# Patient Record
Sex: Male | Born: 1950 | Marital: Married | Smoking: Former smoker
Health system: Southern US, Community
[De-identification: ages and names within clinical notes are randomized; demographics above are authoritative.]

## PROBLEM LIST (undated history)

## (undated) DIAGNOSIS — Z794 Long term (current) use of insulin: Secondary | ICD-10-CM

## (undated) DIAGNOSIS — K219 Gastro-esophageal reflux disease without esophagitis: Secondary | ICD-10-CM

## (undated) DIAGNOSIS — M1990 Unspecified osteoarthritis, unspecified site: Secondary | ICD-10-CM

## (undated) DIAGNOSIS — E119 Type 2 diabetes mellitus without complications: Secondary | ICD-10-CM

## (undated) DIAGNOSIS — C801 Malignant (primary) neoplasm, unspecified: Secondary | ICD-10-CM

## (undated) DIAGNOSIS — I10 Essential (primary) hypertension: Secondary | ICD-10-CM

## (undated) HISTORY — DX: Unspecified osteoarthritis, unspecified site: M19.90

## (undated) HISTORY — DX: Essential (primary) hypertension: I10

---

## 2012-03-15 HISTORY — PX: BIOPSY PROSTATE: PRO28

## 2012-04-19 ENCOUNTER — Encounter: Payer: Self-pay | Admitting: Radiation Oncology

## 2012-04-20 ENCOUNTER — Ambulatory Visit
Admission: RE | Admit: 2012-04-20 | Discharge: 2012-04-20 | Disposition: A | Discharge status: Home / Self Care | Payer: BC Managed Care – PPO | Source: Ambulatory Visit | Attending: Radiation Oncology | Admitting: Radiation Oncology

## 2012-04-20 ENCOUNTER — Encounter: Payer: Self-pay | Admitting: Radiation Oncology

## 2012-04-20 VITALS — BP 162/88 | HR 71 | Temp 98.3°F | Wt 251.9 lb

## 2012-04-20 DIAGNOSIS — I10 Essential (primary) hypertension: Secondary | ICD-10-CM

## 2012-04-20 DIAGNOSIS — C61 Malignant neoplasm of prostate: Secondary | ICD-10-CM | POA: Insufficient documentation

## 2012-04-20 DIAGNOSIS — E119 Type 2 diabetes mellitus without complications: Secondary | ICD-10-CM

## 2012-04-20 DIAGNOSIS — E7800 Pure hypercholesterolemia, unspecified: Secondary | ICD-10-CM

## 2012-04-20 NOTE — Progress Notes (Signed)
Patient here today with wife for radiation consultation for newly diagnosed prostate cancer.Patient believes he wants to have seed implant at this point.Alert and oriented x 3.Still employed with [employer].No family history of prostate or any other cancer.

## 2012-04-20 NOTE — Progress Notes (Signed)
  Radiation Oncology         [phone] ________________________________  Name: Timothy Wolfe MRN: [id number]  Date: 04/20/2012  DOB: 1951/04/15  SIMULATION AND TREATMENT PLANNING NOTE PUBIC ARCH STUDY  CC:No primary provider on file.  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  COMPLEX SIMULATION:  The patient presented today for evaluation for possible prostate seed implant. He was brought to the radiation planning suite and placed supine on the CT couch. A 3-dimensional image study set was obtained in upload to the planning computer. There, on each axial slice, I contoured the prostate gland. Then, using three-dimensional radiation planning tools I reconstructed the prostate in view of the structures from the transperineal needle pathway to assess for possible pubic arch interference. In doing so, I did not appreciate any pubic arch interference. Also, the patient's prostate volume was estimated based on the drawn structure. The volume was 57.89 cc.  Given the pubic arch appearance and prostate volume, patient remains a good candidate to proceed with prostate seed implant. Today, he freely provided informed written consent to proceed.    PLAN: The patient will undergo prostate seed implant with I-125 to 145 Gy.   ________________________________  Artist Pais. Kathrynn Running, M.D.

## 2012-04-20 NOTE — Progress Notes (Signed)
Please see the Nurse Progress Note in the MD Initial Consult Encounter for this patient. 

## 2012-04-21 ENCOUNTER — Other Ambulatory Visit: Payer: Self-pay | Admitting: Urology

## 2012-04-21 ENCOUNTER — Encounter: Payer: Self-pay | Admitting: Radiation Oncology

## 2012-04-21 ENCOUNTER — Telehealth: Payer: Self-pay | Admitting: *Deleted

## 2012-04-21 DIAGNOSIS — E7800 Pure hypercholesterolemia, unspecified: Secondary | ICD-10-CM | POA: Insufficient documentation

## 2012-04-21 DIAGNOSIS — I10 Essential (primary) hypertension: Secondary | ICD-10-CM | POA: Insufficient documentation

## 2012-04-21 DIAGNOSIS — E119 Type 2 diabetes mellitus without complications: Secondary | ICD-10-CM | POA: Insufficient documentation

## 2012-04-21 NOTE — Progress Notes (Signed)
Radiation Oncology         [phone]) [phone] ________________________________  Initial outpatient Consultation  Name: Timothy Wolfe MRN: [id number]  Date: 04/20/2012  DOB: 03-18-1951  CC:  Garnett Farm, MD   REFERRING PHYSICIAN: Garnett Farm, MD  DIAGNOSIS: 61 year old gentleman with stage T1c adenocarcinoma prostate with Gleason score 3+3 PSA of 7.9  HISTORY OF PRESENT ILLNESS::Timothy Wolfe is a 61 y.o. gentleman.  He was noted to have an elevated PSA of 7.9 by his primary care physician, Dr. Royal Hawthorn.  Accordingly, he was referred for evaluation in urology by Dr. Vernie Ammons on 03/03/2012,  digital rectal examination was performed at that time revealing 1+ prostate with no nodules.  The patient proceeded to transrectal ultrasound with 12 biopsies of the prostate on 03/15/2012.  The prostate volume measured 63 cc.  Out of 12 core biopsies, one was positive.  The maximum Gleason score was 3+3, and this was seen in 20% of the left base.  The patient reviewed the biopsy results with his urologist and he has kindly been referred today for discussion of potential radiation treatment options.   PREVIOUS RADIATION THERAPY: No  PAST MEDICAL HISTORY:  has a past medical history of Arthritis; Diabetes 1.5, managed as type 2; and Hypertension.    PAST SURGICAL HISTORY: Past Surgical History  Procedure Date  . Biopsy prostate 03/15/2012    3+3=6    FAMILY HISTORY: family history is not on file.  SOCIAL HISTORY:  reports that he quit smoking about 32 years ago. His smoking use included Cigarettes. He smoked 1 pack per day. He has never used smokeless tobacco. He reports that he does not drink alcohol.  ALLERGIES: Review of patient's allergies indicates not on file.  MEDICATIONS:  Current Outpatient Prescriptions  Medication Sig Dispense Refill  . cloNIDine (CATAPRES) 0.1 MG tablet Take 0.1 mg by mouth 2 (two) times daily.      . furosemide (LASIX) 20 MG tablet Take 20 mg by mouth 2 (two) times  daily. 2 po qam and 1 po qpm      . glimepiride (AMARYL) 4 MG tablet Take 4 mg by mouth daily before breakfast.      . hydrALAZINE (APRESOLINE) 25 MG tablet Take 25 mg by mouth QID.       Marland Kitchen insulin aspart (NOVOLOG) 100 UNIT/ML injection Inject into the skin 3 (three) times daily before meals.      . metoprolol succinate (TOPROL-XL) 50 MG 24 hr tablet Take 50 mg by mouth daily. Take with or immediately following a meal.       . potassium chloride SA (K-DUR,KLOR-CON) 20 MEQ tablet Take 20 mEq by mouth 2 (two) times daily.       . ranitidine (ZANTAC) 150 MG tablet Take 150 mg by mouth every morning.       Marland Kitchen aspirin 81 MG tablet Take 81 mg by mouth daily.      . chlorthalidone (HYGROTON) 25 MG tablet Take 25 mg by mouth daily.      . insulin detemir (LEVEMIR) 100 UNIT/ML injection Inject 35 Units into the skin at bedtime.      Marland Kitchen lisinopril (PRINIVIL,ZESTRIL) 40 MG tablet Take 40 mg by mouth daily.      . rosuvastatin (CRESTOR) 10 MG tablet Take 10 mg by mouth daily.        REVIEW OF SYSTEMS:  A 15 point review of systems is documented in the electronic medical record. This was obtained by the nursing staff. However, I  reviewed this with the patient to discuss relevant findings and make appropriate changes.  A comprehensive review of systems was negative.  The patient did complete the urinary function questionnaire indicating moderate outflow obstructive symptoms with a total IPS S. score of 10.  He reported baseline erectile dysfunction   PHYSICAL EXAM:  weight is 251 lb 14.4 oz (114.261 kg). His temperature is 98.3 F (36.8 C). His blood pressure is 162/88 and his pulse is 71.   Patient was in no acute distress today. Is alert and oriented. Further detailed physical exams deferred today in the interest of patient counseling. please note the digital rectal exam findings described above as performed by Dr. Marcelle Overlie.  IMPRESSION: Timothy Wolfe is a very nice 61 year old gentleman with stage T1c  adenocarcinoma prostate with Gleason score 3+3 PSA of 7.9. He falls into the favorable subgroup of patients eligible for a variety of potential curative treatment options including watchful waiting, radical prostatectomy, external beam radiation treatment or prostate implant as monotherapy.  PLAN:  Today I reviewed the findings and workup thus far.  We discussed the natural history of prostate cancer.  We reviewed the the implications of T-stage, Gleason's Score, and PSA on decision-making and outcomes in prostate cancer.  We discussed radiation treatment in the management of prostate cancer with regard to the logistics and delivery of external beam radiation treatment as well as the logistics and delivery of prostate brachytherapy.  We compared and contrasted each of these approaches and also compared these against prostatectomy.  The patient expressed interest in prostate brachytherapy.  I filled out a patient counseling form for him with relevant treatment diagrams and we retained a copy for our records.   The patient would like to proceed with prostate brachytherapy.  I will share my findings with Dr. Vernie Ammons and move forward with scheduling the procedure in the near future.     I enjoyed meeting with him today, and will look forward to participating in the care of this very nice gentleman.  I spent 60 minutes minutes face to face with the patient and more than 50% of that time was spent in counseling and/or coordination of care.   ------------------------------------------------  Artist Pais. Kathrynn Running, M.D.

## 2012-04-21 NOTE — Telephone Encounter (Signed)
Called patient to inform of implant date, spoke with patient and he is aware of this procedure °

## 2012-05-03 ENCOUNTER — Encounter (HOSPITAL_BASED_OUTPATIENT_CLINIC_OR_DEPARTMENT_OTHER)
Admission: RE | Admit: 2012-05-03 | Discharge: 2012-05-03 | Disposition: A | Discharge status: Home / Self Care | Payer: BC Managed Care – PPO | Source: Ambulatory Visit | Attending: Urology | Admitting: Urology

## 2012-05-03 ENCOUNTER — Ambulatory Visit (HOSPITAL_BASED_OUTPATIENT_CLINIC_OR_DEPARTMENT_OTHER)
Admission: RE | Admit: 2012-05-03 | Discharge: 2012-05-03 | Disposition: A | Discharge status: Home / Self Care | Payer: BC Managed Care – PPO | Source: Ambulatory Visit | Attending: Urology | Admitting: Urology

## 2012-05-03 DIAGNOSIS — I440 Atrioventricular block, first degree: Secondary | ICD-10-CM | POA: Insufficient documentation

## 2012-05-03 DIAGNOSIS — Z01818 Encounter for other preprocedural examination: Secondary | ICD-10-CM | POA: Insufficient documentation

## 2012-05-26 ENCOUNTER — Telehealth: Payer: Self-pay | Admitting: *Deleted

## 2012-05-26 NOTE — Telephone Encounter (Signed)
Called patient to remind of appt., lvm for a return call 

## 2012-05-27 ENCOUNTER — Encounter (HOSPITAL_BASED_OUTPATIENT_CLINIC_OR_DEPARTMENT_OTHER): Payer: Self-pay | Admitting: *Deleted

## 2012-05-27 LAB — CBC
HCT: 43.4 % (ref 39.0–52.0)
Hemoglobin: 15.5 g/dL (ref 13.0–17.0)
MCH: 30.8 pg (ref 26.0–34.0)
MCHC: 35.7 g/dL (ref 30.0–36.0)
MCV: 86.1 fL (ref 78.0–100.0)
Platelets: 211 10*3/uL (ref 150–400)
RBC: 5.04 MIL/uL (ref 4.22–5.81)
RDW: 12.4 % (ref 11.5–15.5)
WBC: 7.4 10*3/uL (ref 4.0–10.5)

## 2012-05-27 LAB — APTT: aPTT: 30 seconds (ref 24–37)

## 2012-05-27 LAB — COMPREHENSIVE METABOLIC PANEL
ALT: 20 U/L (ref 0–53)
AST: 19 U/L (ref 0–37)
Albumin: 3.8 g/dL (ref 3.5–5.2)
Alkaline Phosphatase: 75 U/L (ref 39–117)
BUN: 18 mg/dL (ref 6–23)
CO2: 28 mEq/L (ref 19–32)
Calcium: 9.4 mg/dL (ref 8.4–10.5)
Chloride: 104 mEq/L (ref 96–112)
Creatinine, Ser: 1.34 mg/dL (ref 0.50–1.35)
GFR calc Af Amer: 64 mL/min — ABNORMAL LOW (ref >90)
GFR calc non Af Amer: 56 mL/min — ABNORMAL LOW (ref >90)
Glucose, Bld: 119 mg/dL — ABNORMAL HIGH (ref 70–99)
Potassium: 3.4 mEq/L — ABNORMAL LOW (ref 3.5–5.1)
Sodium: 141 mEq/L (ref 135–145)
Total Bilirubin: 0.3 mg/dL (ref 0.3–1.2)
Total Protein: 7.4 g/dL (ref 6.0–8.3)

## 2012-05-27 LAB — PROTIME-INR
INR: 1.06 (ref 0.00–1.49)
Prothrombin Time: 14 seconds (ref 11.6–15.2)

## 2012-05-27 NOTE — Progress Notes (Signed)
NPO AFTER MN. ARRIVES AT 0815. CURRENT EKG AND CXR IN EPIC AND CHART. LAB WORK DONE TODAY.  WILL TAKE CLONIDINE AND METOPROLOL AM OF SURG W/ SIP OF WATER AND DO FLEET ENEMA.

## 2012-06-02 ENCOUNTER — Telehealth: Payer: Self-pay | Admitting: *Deleted

## 2012-06-02 NOTE — Telephone Encounter (Signed)
Called patient to remind of procedure for 06-03-12, lvm for a return call

## 2012-06-02 NOTE — H&P (Signed)
History of Present Illness      Adenocarcinoma of the prostate: His PSA was noted to be  normal at 4.0 in 9/11 however it had increased to 7.9 in 5/13. He was noted to have no abnormality on DRE. He underwent TRUS/BX on 03/15/12 which revealed a 63 cc prostate. Pathology: Gleason 3+3 = 6 in 20% of 1/12 cores (left base medially). Clinical stage: T1c  BPH with mild LUTS: He has noted some obstructive symptoms such as slowing of his urinary stream as well as some intermittency. He also reports occasionally he'll have a little bit of mild dysuria. IPSS 03/03/12: 12/35 with a bother score of mixed.   Interval history: He took the Rapaflo and found that he did have improved voiding but developed significant difficulty breathing especially at night. He said it also caused his blood sugar to run a little bit higher and he also thought it might have created some anxiety but I told him I thought that was more likely due to his recent diagnosis of cancer.   Past Medical History Problems  1. History of  Arthritis V13.4 2. History of  Diabetes Mellitus 250.00 3. Former Smoker V15.82 4. History of  Hypertension 401.9  Surgical History Problems  1. History of  Biopsy Of The Prostate Needle 2. History of  No Surgical Problems  Current Meds 1. Aspirin 81 MG Oral Tablet; Therapy: (Recorded:06Jun2013) to 2. Centrum Silver Oral Tablet; Therapy: (Recorded:06Jun2013) to 3. Chlorthalidone 25 MG Oral Tablet; Therapy: 04Jun2012 to 4. CloNIDine HCl 0.1 MG Oral Tablet; Therapy: (Recorded:06Jun2013) to 5. Crestor 10 MG Oral Tablet; Therapy: 17Sep2012 to 6. Furosemide 20 MG Oral Tablet; Therapy: (Recorded:06Jun2013) to 7. Glimepiride 4 MG Oral Tablet; Therapy: (Recorded:06Jun2013) to 8. HydrALAZINE HCl 25 MG Oral Tablet; Therapy: 04Mar2013 to 9. Levemir FlexPen 100 UNIT/ML Subcutaneous Solution; Therapy: 19Sep2012 to 10. Levofloxacin 500 MG Oral Tablet; 1 po q day beginning the day prior to biopsy; Therapy:  06Jun2013 to (Evaluate:09Jun2013)  Requested for: 06Jun2013; Last Rx:06Jun2013 11. Lisinopril 40 MG Oral Tablet; Therapy: (Recorded:06Jun2013) to 12. Metoprolol Tartrate 50 MG Oral Tablet; Therapy: (Recorded:06Jun2013) to 13. NovoLOG FlexPen 100 UNIT/ML Subcutaneous Solution; Therapy: 25Jun2012 to 14. Potassium Chloride Crys ER 20 MEQ Oral Tablet Extended Release; Therapy: 26Jun2012 to 15. Rapaflo 8 MG Oral Capsule; 1 po q day; Therapy: 02Jul2013 to (Last Rx:02Jul2013) 16. Zantac 150 MG CAPS; Therapy: (Recorded:06Jun2013) to  Allergies Medication  1. No Known Drug Allergies  Family History Problems  1. Maternal history of  Acute Myocardial Infarction V17.3 2. Family history of  Death In The Family Father 76 3. Family history of  Death In The Family Mother 31 4. Family history of  Family Health Status Number Of Children 1 son 5. Paternal history of  Stroke Syndrome V17.1  Social History Problems  1. Caffeine Use 2 a day 2. 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 History of  Alcohol Use  Review of Systems Genitourinary, constitutional, skin, eye, otolaryngeal, hematologic/lymphatic, cardiovascular, pulmonary, endocrine, musculoskeletal, gastrointestinal, neurological and psychiatric system(s) were reviewed and pertinent findings if present are noted.  Genitourinary: nocturia, difficulty starting the urinary stream, weak urinary stream and urinary stream starts and stops.  Gastrointestinal: nausea and heartburn.  Musculoskeletal: joint pain.  Neurological: headache.    Vitals Vital Signs  BMI Calculated: 32.73 BSA Calculated: 2.41 Height: 6 ft 2 in Weight: 255 lb  Blood Pressure: 183 / 94 Heart Rate: 67  Physical Exam Constitutional: Well nourished and well developed . No  acute distress.  ENT:. The ears and nose are normal in appearance.  Neck: The appearance of the neck is normal and no neck mass is present.  Pulmonary: No respiratory  distress and normal respiratory rhythm and effort.  Cardiovascular: Heart rate and rhythm are normal . No peripheral edema.  Abdomen: The abdomen is soft and nontender. No masses are palpated. No CVA tenderness. No hernias are palpable. No hepatosplenomegaly noted.  Rectal: Rectal exam demonstrates normal sphincter tone, no tenderness and no masses. Estimated prostate size is 1+. The prostate has no nodularity and is not tender. The left seminal vesicle is nonpalpable. The right seminal vesicle is nonpalpable. The perineum is normal on inspection.  Genitourinary: Examination of the penis demonstrates no discharge, no masses, no lesions and a normal meatus. The scrotum is without lesions. The right epididymis is palpably normal and non-tender. The left epididymis is palpably normal and non-tender. The right testis is non-tender and without masses. The left testis is non-tender and without masses.  Lymphatics: The femoral and inguinal nodes are not enlarged or tender.  Skin: Normal skin turgor, no visible rash and no visible skin  Assessment Assessed  1. Adenocarcinoma Of The Prostate Gland 185  Plan  The patient was counseled about the natural history of prostate cancer and the standard treatment options that are available for prostate cancer. It was explained to him how his age and life expectancy, clinical stage, Gleason score, and PSA affect his prognosis, the decision to proceed with additional staging studies, as well as how that information influences recommended treatment strategies. We discussed the roles for active surveillance, radiation therapy, surgical therapy, androgen deprivation, as well as ablative therapy options for the treatment of prostate cancer as appropriate to his individual cancer situation. We discussed the risks and benefits of these options with regard to their impact on cancer control and also in terms of potential adverse events, complications, and impact on quiality of  life particularly related to urinary, bowel, and sexual function. The patient was encouraged to ask questions throughout the discussion today and all questions were answered to his stated satisfaction. In addition, the patient was provided with and/or directed to appropriate resources and literature for further education about prostate cancer and treatment options.  A total of 45 minutes were spent in the overall care of the patient today with 45 minutes in direct face to face consultation.   He came to the decision that he would like to proceed with treatment and has elected to proceed with radioactive seed implantation. His prostate is at the upper limits of normal but I think he should be all right for implantation. In addition he does have some obstructive symptoms and sounds like he's not going to be able to tolerate an alpha-blocker. He may need a 5 alpha reductase inhibitor in the long term.

## 2012-06-02 NOTE — Telephone Encounter (Signed)
Called patient to remind of procedure for 06-03-12, spoke with patient and he is aware of this procedure

## 2012-06-02 NOTE — Telephone Encounter (Signed)
CALLED PATIENT TO REMIND OF PROCEDURE FOR 06-03-12, LINE BUSY WILL CALL LATER

## 2012-06-03 ENCOUNTER — Encounter (HOSPITAL_BASED_OUTPATIENT_CLINIC_OR_DEPARTMENT_OTHER): Payer: Self-pay | Admitting: Anesthesiology

## 2012-06-03 ENCOUNTER — Ambulatory Visit (HOSPITAL_BASED_OUTPATIENT_CLINIC_OR_DEPARTMENT_OTHER)
Admission: RE | Admit: 2012-06-03 | Discharge: 2012-06-03 | Disposition: A | Discharge status: Home / Self Care | Payer: BC Managed Care – PPO | Source: Ambulatory Visit | Attending: Urology | Admitting: Urology

## 2012-06-03 ENCOUNTER — Encounter (HOSPITAL_BASED_OUTPATIENT_CLINIC_OR_DEPARTMENT_OTHER)
Admission: RE | Disposition: A | Discharge status: Home / Self Care | Payer: Self-pay | Source: Ambulatory Visit | Attending: Urology

## 2012-06-03 ENCOUNTER — Encounter (HOSPITAL_BASED_OUTPATIENT_CLINIC_OR_DEPARTMENT_OTHER): Payer: Self-pay | Admitting: *Deleted

## 2012-06-03 ENCOUNTER — Ambulatory Visit (HOSPITAL_BASED_OUTPATIENT_CLINIC_OR_DEPARTMENT_OTHER): Payer: BC Managed Care – PPO | Admitting: Anesthesiology

## 2012-06-03 ENCOUNTER — Ambulatory Visit (HOSPITAL_COMMUNITY): Payer: BC Managed Care – PPO

## 2012-06-03 DIAGNOSIS — Z79899 Other long term (current) drug therapy: Secondary | ICD-10-CM | POA: Insufficient documentation

## 2012-06-03 DIAGNOSIS — C61 Malignant neoplasm of prostate: Secondary | ICD-10-CM | POA: Insufficient documentation

## 2012-06-03 DIAGNOSIS — Z7982 Long term (current) use of aspirin: Secondary | ICD-10-CM | POA: Insufficient documentation

## 2012-06-03 DIAGNOSIS — I10 Essential (primary) hypertension: Secondary | ICD-10-CM | POA: Insufficient documentation

## 2012-06-03 DIAGNOSIS — E119 Type 2 diabetes mellitus without complications: Secondary | ICD-10-CM | POA: Insufficient documentation

## 2012-06-03 HISTORY — PX: RADIOACTIVE SEED IMPLANT: SHX5150

## 2012-06-03 HISTORY — DX: Type 2 diabetes mellitus without complications: E11.9

## 2012-06-03 HISTORY — DX: Long term (current) use of insulin: Z79.4

## 2012-06-03 HISTORY — DX: Gastro-esophageal reflux disease without esophagitis: K21.9

## 2012-06-03 LAB — POCT I-STAT 4, (NA,K, GLUC, HGB,HCT)
Glucose, Bld: 145 mg/dL — ABNORMAL HIGH (ref 70–99)
HCT: 43 % (ref 39.0–52.0)
Hemoglobin: 14.6 g/dL (ref 13.0–17.0)
Potassium: 3.4 mEq/L — ABNORMAL LOW (ref 3.5–5.1)
Sodium: 144 mEq/L (ref 135–145)

## 2012-06-03 LAB — GLUCOSE, CAPILLARY: Glucose-Capillary: 150 mg/dL — ABNORMAL HIGH (ref 70–99)

## 2012-06-03 SURGERY — INSERTION, RADIATION SOURCE, PROSTATE
Anesthesia: General | Site: Prostate | Wound class: Clean Contaminated

## 2012-06-03 MED ORDER — ONDANSETRON HCL 4 MG/2ML IJ SOLN
INTRAMUSCULAR | Status: DC | PRN
  Administered 2012-06-03: 4 mg via INTRAVENOUS

## 2012-06-03 MED ORDER — CIPROFLOXACIN IN D5W 400 MG/200ML IV SOLN
400.0000 mg | INTRAVENOUS | Status: AC
  Administered 2012-06-03: 400 mg via INTRAVENOUS

## 2012-06-03 MED ORDER — LIDOCAINE HCL (CARDIAC) 20 MG/ML IV SOLN
INTRAVENOUS | Status: DC | PRN
  Administered 2012-06-03: 100 mg via INTRAVENOUS

## 2012-06-03 MED ORDER — MIDAZOLAM HCL 5 MG/5ML IJ SOLN
INTRAMUSCULAR | Status: DC | PRN
  Administered 2012-06-03: 2 mg via INTRAVENOUS

## 2012-06-03 MED ORDER — OXYCODONE HCL 5 MG PO TABS
5.0000 mg | ORAL_TABLET | Freq: Once | ORAL | Status: AC | PRN
  Administered 2012-06-03: 5 mg via ORAL

## 2012-06-03 MED ORDER — MEPERIDINE HCL 25 MG/ML IJ SOLN: 6.2500 mg | INTRAMUSCULAR | Status: DC | PRN

## 2012-06-03 MED ORDER — OXYCODONE HCL 5 MG/5ML PO SOLN: 5.0000 mg | Freq: Once | ORAL | Status: AC | PRN

## 2012-06-03 MED ORDER — LACTATED RINGERS IV SOLN
INTRAVENOUS | Status: DC
  Administered 2012-06-03 (×2): via INTRAVENOUS

## 2012-06-03 MED ORDER — PROPOFOL 10 MG/ML IV BOLUS
INTRAVENOUS | Status: DC | PRN
  Administered 2012-06-03: 200 mg via INTRAVENOUS
  Administered 2012-06-03: 50 mg via INTRAVENOUS

## 2012-06-03 MED ORDER — IOHEXOL 350 MG/ML SOLN
INTRAVENOUS | Status: DC | PRN
  Administered 2012-06-03: 7 mL

## 2012-06-03 MED ORDER — FENTANYL CITRATE 0.05 MG/ML IJ SOLN
INTRAMUSCULAR | Status: DC | PRN
  Administered 2012-06-03 (×2): 50 ug via INTRAVENOUS

## 2012-06-03 MED ORDER — CIPROFLOXACIN HCL 500 MG PO TABS: 500.0000 mg | ORAL_TABLET | Freq: Two times a day (BID) | ORAL | Status: AC

## 2012-06-03 MED ORDER — FLEET ENEMA 7-19 GM/118ML RE ENEM: 1.0000 | ENEMA | Freq: Once | RECTAL | Status: DC

## 2012-06-03 MED ORDER — ACETAMINOPHEN 10 MG/ML IV SOLN: 1000.0000 mg | Freq: Once | INTRAVENOUS | Status: DC | PRN

## 2012-06-03 MED ORDER — HYDROMORPHONE HCL PF 1 MG/ML IJ SOLN: 0.2500 mg | INTRAMUSCULAR | Status: DC | PRN

## 2012-06-03 MED ORDER — HYDROCODONE-ACETAMINOPHEN 7.5-325 MG PO TABS: 1.0000 | ORAL_TABLET | ORAL | Status: AC | PRN

## 2012-06-03 MED ORDER — PROMETHAZINE HCL 25 MG/ML IJ SOLN: 6.2500 mg | INTRAMUSCULAR | Status: DC | PRN

## 2012-06-03 SURGICAL SUPPLY — 27 items
BAG URINE DRAINAGE (UROLOGICAL SUPPLIES) ×2 IMPLANT
BLADE SURG ROTATE 9660 (MISCELLANEOUS) ×2 IMPLANT
CATH FOLEY 2WAY SLVR  5CC 16FR (CATHETERS) ×2
CATH FOLEY 2WAY SLVR 5CC 16FR (CATHETERS) ×2 IMPLANT
CATH ROBINSON RED A/P 20FR (CATHETERS) ×2 IMPLANT
CLOTH BEACON ORANGE TIMEOUT ST (SAFETY) ×2 IMPLANT
COVER MAYO STAND STRL (DRAPES) ×2 IMPLANT
COVER TABLE BACK 60X90 (DRAPES) ×2 IMPLANT
DRSG TEGADERM 4X4.75 (GAUZE/BANDAGES/DRESSINGS) ×2 IMPLANT
DRSG TEGADERM 8X12 (GAUZE/BANDAGES/DRESSINGS) ×2 IMPLANT
GLOVE BIO SURGEON STRL SZ 6.5 (GLOVE) ×2 IMPLANT
GLOVE BIO SURGEON STRL SZ7.5 (GLOVE) IMPLANT
GLOVE BIO SURGEON STRL SZ8 (GLOVE) ×4 IMPLANT
GLOVE ECLIPSE 8.0 STRL XLNG CF (GLOVE) ×10 IMPLANT
GOWN STRL NON-REIN LRG LVL3 (GOWN DISPOSABLE) ×2 IMPLANT
GOWN STRL REIN XL XLG (GOWN DISPOSABLE) ×4 IMPLANT
GOWN XL W/COTTON TOWEL STD (GOWNS) ×2 IMPLANT
HOLDER FOLEY CATH W/STRAP (MISCELLANEOUS) ×2 IMPLANT
IV NS IRRIG 3000ML ARTHROMATIC (IV SOLUTION) IMPLANT
IV WATER IRR. 1000ML (IV SOLUTION) ×2 IMPLANT
Nucletron ×158 IMPLANT
PACK CYSTOSCOPY (CUSTOM PROCEDURE TRAY) ×2 IMPLANT
SPONGE GAUZE 4X4 12PLY (GAUZE/BANDAGES/DRESSINGS) ×2 IMPLANT
SYRINGE 10CC LL (SYRINGE) ×2 IMPLANT
UNDERPAD 30X30 INCONTINENT (UNDERPADS AND DIAPERS) ×4 IMPLANT
WATER STERILE IRR 3000ML UROMA (IV SOLUTION) ×2 IMPLANT
WATER STERILE IRR 500ML POUR (IV SOLUTION) ×2 IMPLANT

## 2012-06-03 NOTE — Anesthesia Preprocedure Evaluation (Addendum)
Anesthesia Evaluation  Patient identified by MRN, date of birth, ID band Patient awake    Reviewed: Allergy & Precautions, H&P , NPO status , Patient's Chart, lab work & pertinent test results  History of Anesthesia Complications Negative for: history of anesthetic complications  Airway Mallampati: II  Neck ROM: Full  Mouth opening: Limited Mouth Opening  Dental  (+) Teeth Intact and Dental Advisory Given   Pulmonary neg pulmonary ROS,  breath sounds clear to auscultation  Pulmonary exam normal       Cardiovascular hypertension, Pt. on medications and Pt. on home beta blockers Rhythm:Regular Rate:Normal  LVH by voltage, 1st deg AV block   Neuro/Psych negative neurological ROS  negative psych ROS   GI/Hepatic Neg liver ROS, GERD-  Medicated,  Endo/Other  diabetes, Type 2, Insulin Dependent  Renal/GU Renal InsufficiencyRenal diseaseGFR ~60     Musculoskeletal  (+) Arthritis -, Osteoarthritis,    Abdominal (+) + obese,   Peds  Hematology negative hematology ROS (+)   Anesthesia Other Findings   Reproductive/Obstetrics                      Anesthesia Physical Anesthesia Plan  ASA: III  Anesthesia Plan: General   Post-op Pain Management:    Induction: Intravenous  Airway Management Planned: LMA  Additional Equipment:   Intra-op Plan:   Post-operative Plan:   Informed Consent: I have reviewed the patients History and Physical, chart, labs and discussed the procedure including the risks, benefits and alternatives for the proposed anesthesia with the patient or authorized representative who has indicated his/her understanding and acceptance.   Dental advisory given  Plan Discussed with: CRNA and Surgeon  Anesthesia Plan Comments:        Anesthesia Quick Evaluation

## 2012-06-03 NOTE — Interval H&P Note (Signed)
History and Physical Interval Note:  06/03/2012 8:56 AM  Timothy Wolfe  has presented today for surgery, with the diagnosis of PROSTATE CANCER  The various methods of treatment have been discussed with the patient and family. After consideration of risks, benefits and other options for treatment, the patient has consented to  Procedure(s) (LRB) with comments: RADIOACTIVE SEED IMPLANT (N/A) - rad tech ok per vickie at main DR PORTABLE NEEDED as a surgical intervention .  The patient's history has been reviewed, patient examined, no change in status, stable for surgery.  I have reviewed the patient's chart and labs.  Questions were answered to the patient's satisfaction.     Garnett Farm

## 2012-06-03 NOTE — Anesthesia Procedure Notes (Signed)
Procedure Name: LMA Insertion Date/Time: 06/03/2012 9:41 AM Performed by: Maris Berger T Pre-anesthesia Checklist: Patient identified, Emergency Drugs available, Suction available and Patient being monitored Patient Re-evaluated:Patient Re-evaluated prior to inductionOxygen Delivery Method: Circle System Utilized Preoxygenation: Pre-oxygenation with 100% oxygen Intubation Type: IV induction Ventilation: Mask ventilation without difficulty LMA: LMA inserted LMA Size: 5.0 Number of attempts: 1 Placement Confirmation: positive ETCO2 Dental Injury: Teeth and Oropharynx as per pre-operative assessment  Comments: Gauze roll between teeth

## 2012-06-03 NOTE — Op Note (Signed)
PATIENT:  Timothy Wolfe  PRE-OPERATIVE DIAGNOSIS:  Adenocarcinoma of the prostate  POST-OPERATIVE DIAGNOSIS:  Same  PROCEDURE:  Procedure(s): 1. I-125 radioactive seed implantation 2. Cystoscopy  SURGEON:  Surgeon(s): Garnett Farm  Radiation oncologist: Dr. Margaretmary Dys  ANESTHESIA:  General  EBL:  Minimal  DRAINS: 16 French Foley catheter  INDICATION: Timothy Wolfe is a 61 year old male with biopsy-proven adenocarcinoma of the prostate. He was found to have Gleason 6 adenocarcinoma and a single core involving the left basilar region on biopsy. Scleral surgical stage is T1c and he has elected to proceed with radioactive seed implantation.  Description of procedure: After informed consent the patient was brought to the major OR, placed on the table and administered general anesthesia. He was then moved to the modified lithotomy position with his perineum perpendicular to the floor. His perineum and genitalia were then sterilely prepped. An official timeout was then performed. A 16 French Foley catheter was then placed in the bladder and filled with dilute contrast, a rectal tube was placed in the rectum and the transrectal ultrasound probe was placed in the rectum and affixed to the stand. He was then sterilely draped. A  Real time ultrasonography was used along with the seed planning software spot-pro version 3.1-00. This was used to develop the seed plan including the number of needles as well as number of seeds required for complete and adequate coverage. Real-time ultrasonography was then used along with the previously developed plan and the Nucletron device to implant a total of 79 seeds using 23 needles. This proceeded without difficulty or complication.  A Foley catheter was then removed as well as the transrectal ultrasound probe and rectal probe. Flexible cystoscopy was then performed using the 17 French flexible scope which revealed a normal urethra throughout its length  down to the sphincter which appeared intact. The prostatic urethra revealed bilobar hypertrophy but no evidence of obstruction, seeds, spacers or lesions. The bladder was then entered and fully and systematically inspected. The ureteral orifices were noted to be of normal configuration and position. The mucosa revealed no evidence of tumors. There were also no stones identified within the bladder. I noted no seeds or spacers on the floor of the bladder and retroflexion of the scope revealed no seeds protruding from the base of the prostate.  The cystoscope was then removed and a new 16 French Foley catheter was then inserted and the balloon was filled with 10 cc of sterile water. This was connected to closed system drainage and the patient was awakened and taken to recovery room in stable and satisfactory condition. He tolerated procedure well and there were no intraoperative complications.

## 2012-06-03 NOTE — Anesthesia Postprocedure Evaluation (Signed)
Anesthesia Post Note  Patient: Timothy Wolfe  Procedure(s) Performed: Procedure(s) (LRB): RADIOACTIVE SEED IMPLANT (N/A)  Anesthesia type: General  Patient location: PACU  Post pain: Pain level controlled  Post assessment: Post-op Vital signs reviewed  Last Vitals: BP 165/97  Pulse 60  Temp 36.6 C (Oral)  Resp 14  Ht 6\' 2"  (1.88 m)  Wt 247 lb (112.038 kg)  BMI 31.71 kg/m2  SpO2 98%  Post vital signs: Reviewed  Level of consciousness: sedated  Complications: No apparent anesthesia complications

## 2012-06-03 NOTE — Transfer of Care (Signed)
Immediate Anesthesia Transfer of Care Note  Patient: Timothy Wolfe  Procedure(s) Performed: Procedure(s) (LRB) with comments: RADIOACTIVE SEED IMPLANT (N/A) - rad tech ok per vickie at main DR PORTABLE NEEDED  Patient Location: PACU  Anesthesia Type: General  Level of Consciousness: awake, sedated and responds to stimulation  Airway & Oxygen Therapy: Patient Spontanous Breathing and Patient connected to face mask oxygen  Post-op Assessment: Report given to PACU RN  Post vital signs: Reviewed and stable  Complications: No apparent anesthesia complications

## 2012-06-05 NOTE — Op Note (Signed)
  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  RADIATION DOSE: 145 Gy, definitive therapy.  TECHNIQUE: Damier Disano was brought to the operating room with the urologist. He was placed in the dorsolithotomy position. He was catheterized and a rectal tube was inserted. The perineum was shaved, prepped and draped. The ultrasound probe was then introduced into the rectum to see the prostate gland.  TREATMENT DEVICE: A needle grid was attached to the ultrasound probe stand and anchor needles were placed.  COMPLEX ISODOSE CALCULATION: The prostate was imaged in 3D using a sagittal sweep of the prostate probe. These images were transferred to the planning computer. There, the prostate, urethra and rectum were defined on each axial reconstructed image. Then, the software created an optimized plan and a few seed positions were adjusted. Then the accepted plan was uploaded to the seed Selectron afterloading unit.  SPECIAL TREATMENT PROCEDURE/SUPERVISION AND HANDLING: The Nucletron FIRST system was used to place the needles under sagittal guidance. A total of 23 needles were used to deposit 79 seeds in the prostate gland. The individual seed activity was 0.586 mCi for a total implant activity of 46.294 mCi.  COMPLEX SIMULATION: At the end of the procedure, an anterior radiograph of the pelvis was obtained to document seed positioning and count. Cystoscopy was performed to check the urethra and bladder.  MICRODOSIMETRY: At the end of the procedure, the patient was emitting 0.61 mrem/hr at 1 meter. Accordingly, he was considered safe for hospital discharge.  PLAN: The patient will return to the  radiation oncology clinic for post implant CT dosimetry in three weeks.   ________________________________  Artist Pais Kathrynn Running, M.D.

## 2012-06-06 ENCOUNTER — Encounter (HOSPITAL_BASED_OUTPATIENT_CLINIC_OR_DEPARTMENT_OTHER): Payer: Self-pay | Admitting: Urology

## 2012-06-06 NOTE — Addendum Note (Signed)
Addendum  created 06/06/12 1049 by Briant Sites, CRNA   Modules edited:Anesthesia Responsible Staff

## 2012-06-23 ENCOUNTER — Telehealth: Payer: Self-pay | Admitting: *Deleted

## 2012-06-23 NOTE — Telephone Encounter (Signed)
CALLED PATIENT TO REMIND OF APPTS. FOR 06-24-12, LVM FOR A RETURN CALL

## 2012-06-24 ENCOUNTER — Ambulatory Visit
Admission: RE | Admit: 2012-06-24 | Discharge: 2012-06-24 | Disposition: A | Discharge status: Home / Self Care | Payer: BC Managed Care – PPO | Source: Ambulatory Visit | Attending: Radiation Oncology | Admitting: Radiation Oncology

## 2012-06-24 ENCOUNTER — Encounter: Payer: Self-pay | Admitting: Radiation Oncology

## 2012-06-24 VITALS — BP 159/85 | HR 67 | Temp 98.2°F | Resp 20 | Wt 253.3 lb

## 2012-06-24 DIAGNOSIS — C61 Malignant neoplasm of prostate: Secondary | ICD-10-CM

## 2012-06-24 NOTE — Progress Notes (Signed)
Radiation Oncology         [phone] ________________________________  Name: Timothy Wolfe MRN: [id number]  Date: 06/24/2012  DOB: 28-Dec-1950  Follow-Up Visit Note  CC: No primary provider on file.  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  He is complaining of increased urinary frequency and urinary hesitation symptoms. He filled out a questionnaire regarding urinary function today providing and overall IPSS score of 23 characterizing his symptoms as severe.  His pre-implant score was 10. He denies any bowel symptoms.  ALLERGIES:   has no known allergies.  Meds: Current Outpatient Prescriptions  Medication Sig Dispense Refill  . aspirin 81 MG tablet Take 81 mg by mouth daily.      . chlorthalidone (HYGROTON) 25 MG tablet Take 25 mg by mouth daily.      . cloNIDine (CATAPRES) 0.1 MG tablet Take 0.5 mg by mouth 2 (two) times daily.       . furosemide (LASIX) 20 MG tablet Take 20 mg by mouth daily. 2 po qam and 1 po qpm      . glimepiride (AMARYL) 4 MG tablet Take 4 mg by mouth 2 (two) times daily.       . hydrALAZINE (APRESOLINE) 25 MG tablet Take 25 mg by mouth QID.       Marland Kitchen insulin aspart (NOVOLOG) 100 UNIT/ML injection Inject into the skin 3 (three) times daily before meals. PER SS      . insulin detemir (LEVEMIR) 100 UNIT/ML injection Inject 35 Units into the skin at bedtime.      Marland Kitchen lisinopril (PRINIVIL,ZESTRIL) 40 MG tablet Take 40 mg by mouth daily.      . Meth-Hyo-M Bl-Na Phos-Ph Sal (URIBEL) 118 MG CAPS Take 1 capsule by mouth Twice daily.      . metoprolol (LOPRESSOR) 50 MG tablet Take 50 mg by mouth Twice daily. Takes 1.5 tabs bid      . metoprolol succinate (TOPROL-XL) 50 MG 24 hr tablet Take 75 mg by mouth 2 (two) times daily. Take with or immediately following a meal.      .  potassium chloride SA (K-DUR,KLOR-CON) 20 MEQ tablet Take 20 mEq by mouth 2 (two) times daily.       . ranitidine (ZANTAC) 150 MG tablet Take 150 mg by mouth every morning.       . rosuvastatin (CRESTOR) 10 MG tablet Take 10 mg by mouth every evening.         Physical Findings: The patient is in no acute distress. Patient is alert and oriented.  vitals were not taken for this visit..  No significant changes.  Lab Findings: Lab Results  Component Value Date   WBC 7.4 05/27/2012   HGB 14.6 06/03/2012   HCT 43.0 06/03/2012   MCV 86.1 05/27/2012   PLT 211 05/27/2012    Radiographic Findings:  Patient underwent CT imaging in our clinic for post implant dosimetry. The CT appears to demonstrate an adequate distribution of radioactive seeds throughout the prostate gland. There no seeds in her near the rectum. I suspect the final radiation plan and dosimetry will show appropriate coverage of the prostate gland.   Impression: The patient is recovering from the effects of radiation. His urinary symptoms should gradually improve over the next 4-6 months. We talked about this today. He  is encouraged by his improvement already and is otherwise please with his outcome.   Plan: Today, I spent time talking to the patient about his prostate seed implant and resolving urinary symptoms. Which for long-term followup for prostate cancer following seed implant. He understands that ongoing PSA determinations and digital rectal exams will help perform surveillance to rule out disease recurrence. He understands what to expect with his PSA measures. Patient was also educated today about some of the long-term effects would radiation including the Small risk for rectal bleeding and possibly erectile dysfunction. Talked about some of the general management approaches to these potential complications. However, I did encourage the patient to contact her office or return at any point if he has questions or concerns related to his  previous radiation and prostate cancer.   _____________________________________  Artist Pais. Kathrynn Running, M.D.

## 2012-06-24 NOTE — Assessment & Plan Note (Signed)
Plan seed implant 

## 2012-06-24 NOTE — Progress Notes (Signed)
  Radiation Oncology         [phone] ________________________________  Name: Timothy Wolfe MRN: [id number]  Date: 06/24/2012  DOB: 07-12-1951  COMPLEX SIMULATION NOTE  NARRATIVE:  The patient was brought to the CT Simulation planning suite today following prostate seed implantation approximately one month ago.  Identity was confirmed.  All relevant records and images related to the planned course of therapy were reviewed.  Then, the patient was set-up supine.  CT images were obtained.  The CT images were loaded into the planning software.  Then the prostate and rectum were contoured.  Treatment planning then occurred.  The implanted iodine 125 seeds were identified by the physics staff for projection of radiation distribution  I have requested : 3D Simulation  I have requested a DVH of the following structures: Prostate and rectum.    ________________________________  Artist Pais Kathrynn Running, M.D.

## 2012-06-24 NOTE — Progress Notes (Signed)
Patient her  prostate post seed implant done on 06/03/12 with Dr. Vernie Ammons  With 79 seeds pateint alert,oriented x3, has dysuria frequency,urgenay occasionally stated, also still has blood when voiding last time yesterday reddish gray color stated patient when first starting his stream, nocturia usually 3x night, only got up 1x last night, eating drinking well,  Normal bowel movements,  IPPS =[id number]

## 2012-09-02 ENCOUNTER — Ambulatory Visit
Admission: RE | Admit: 2012-09-02 | Discharge: 2012-09-02 | Disposition: A | Discharge status: Home / Self Care | Payer: BC Managed Care – PPO | Source: Ambulatory Visit | Attending: Radiation Oncology | Admitting: Radiation Oncology

## 2012-09-02 ENCOUNTER — Encounter: Payer: Self-pay | Admitting: Radiation Oncology

## 2012-09-02 DIAGNOSIS — C61 Malignant neoplasm of prostate: Secondary | ICD-10-CM | POA: Insufficient documentation

## 2012-09-18 NOTE — Progress Notes (Signed)
  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 He underwent CT scan to delineate the three-dimensional structures of the pelvis and demonstrate the radiation distribution.  Results:   Prostate Coverage - The dose of radiation delivered to the 90% or more of the prostate gland (D90) was 112.09% of the prescription dose. This exceeds our goal of greater than 90%. Rectal Sparing - The volume of rectal tissue receiving the prescription dose or higher was 0.14 cc. This falls under our thresholds tolerance of 1.0 cc.  Impression: The prostate seed implant appears to show adequate target coverage and appropriate rectal sparing.  Plan:  The patient will continue to follow with urology for ongoing PSA determinations. I would anticipate a high likelihood for local tumor control with minimal risk for rectal morbidity.   Artist Pais Kathrynn Running, M.D.

## 2012-09-28 HISTORY — PX: INSERTION PROSTATE RADIATION SEED: SUR718

## 2012-10-14 IMAGING — CR DG CHEST 2V
2 series · 2 of 2 positions shown · non-contrast
Comparison: No priors.

CLINICAL DATA: Preoperative evaluation.

CHEST - 2 VIEW

[w chest pa]
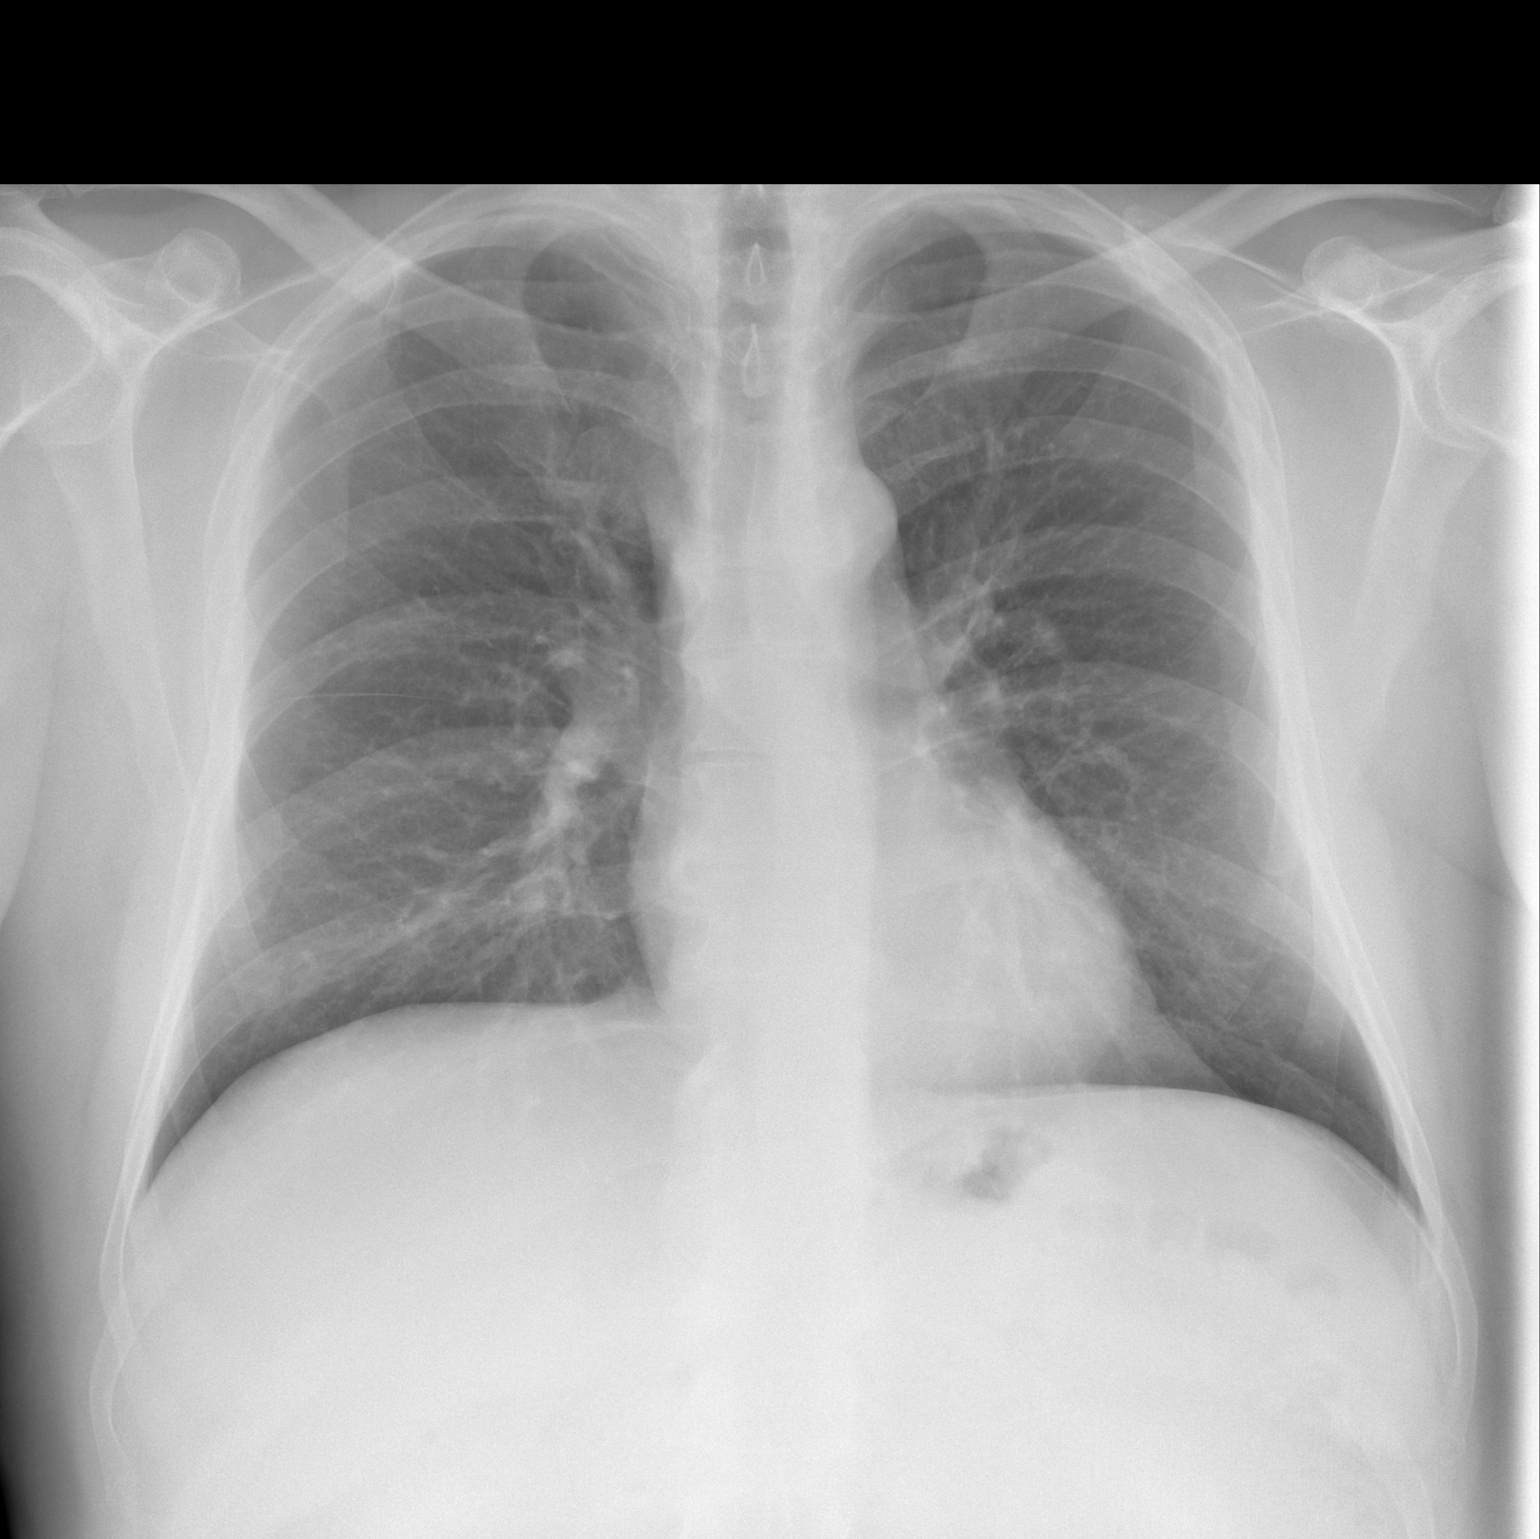

[w chest lat]
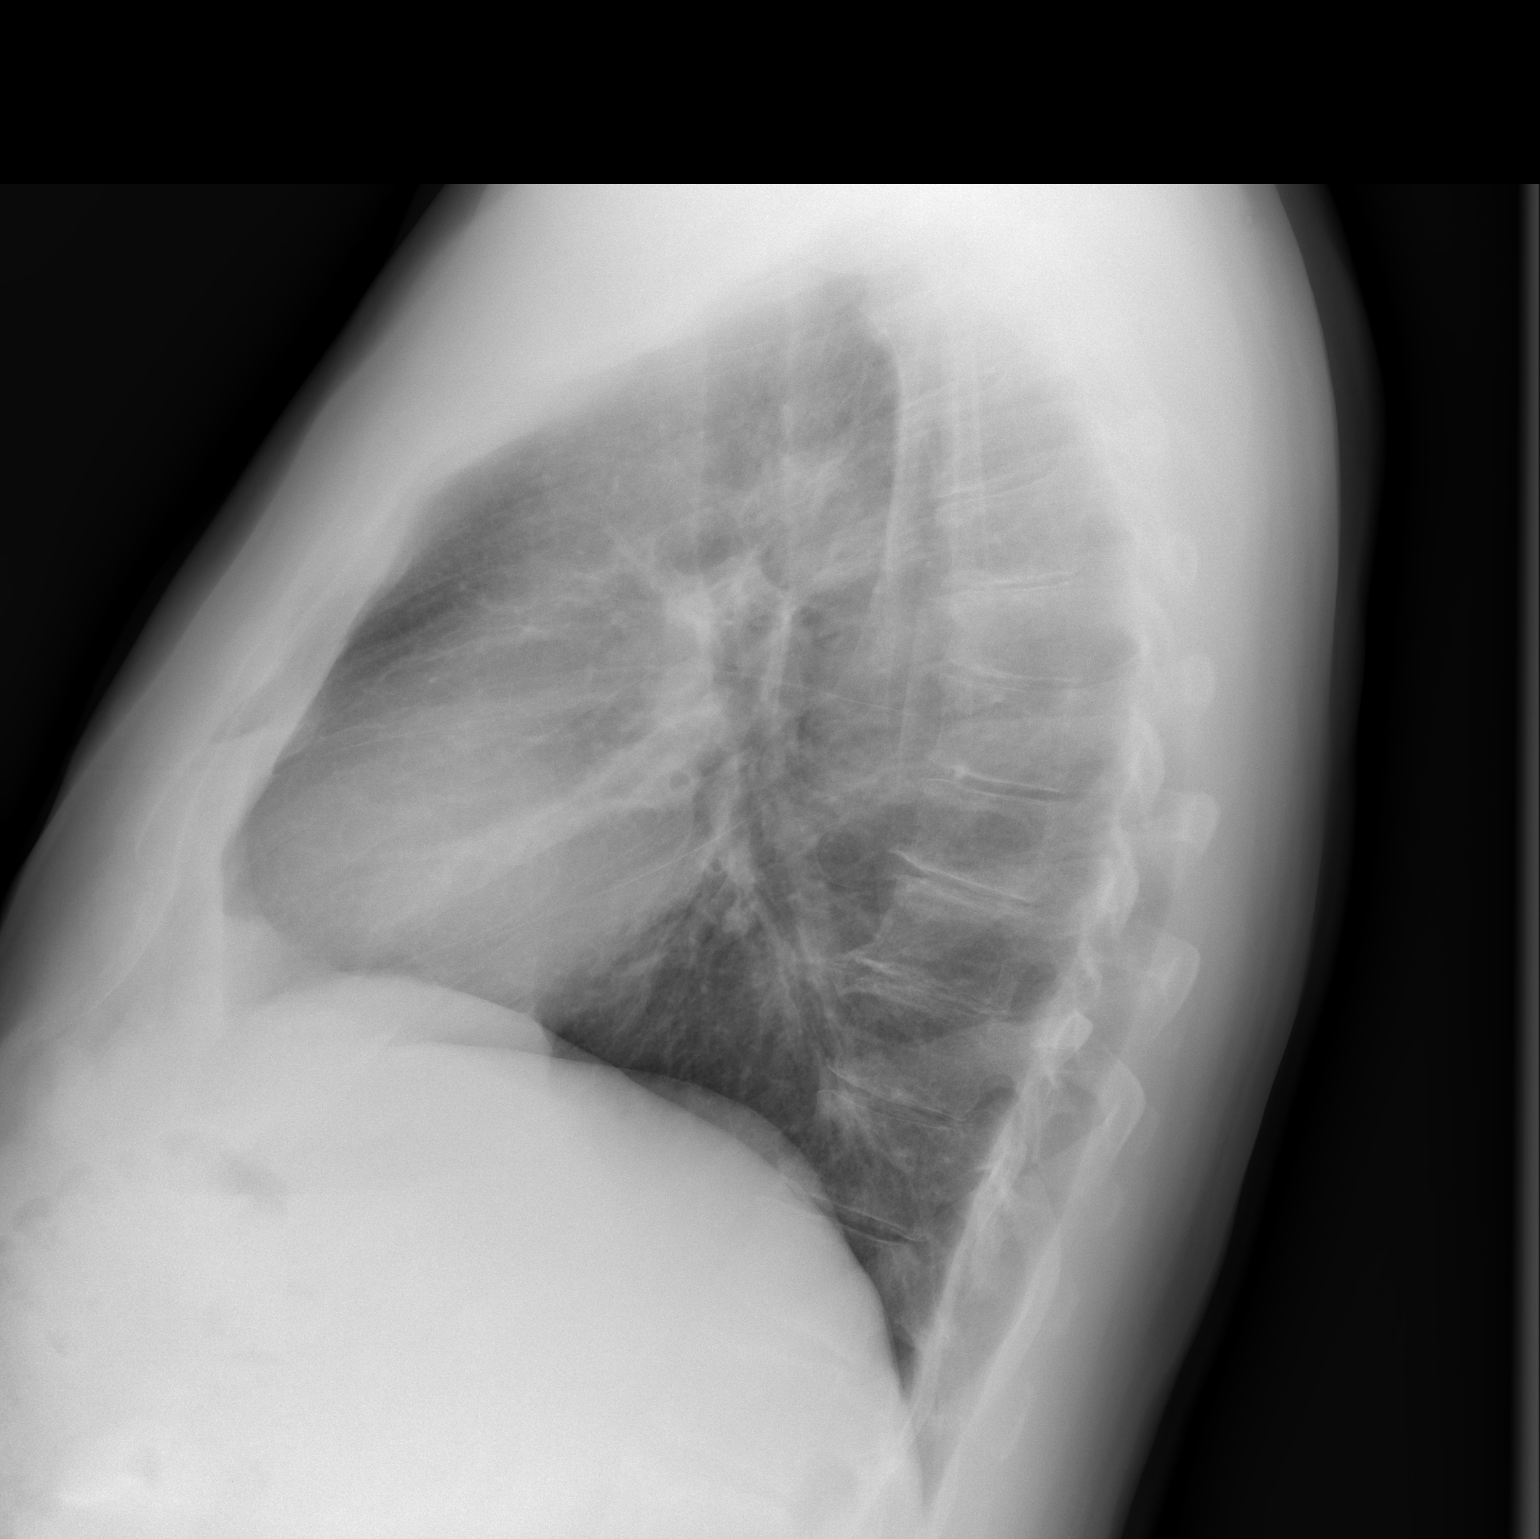

[2 of 2 positions shown; findings below may reference images not displayed]

FINDINGS: Lung volumes are normal.  No consolidative airspace
disease.  No pleural effusions.  No pneumothorax.  No pulmonary
nodule or mass noted.  Pulmonary vasculature and the
cardiomediastinal silhouette are within normal limits.
IMPRESSION: 1. No radiographic evidence of acute cardiopulmonary disease.

## 2013-04-24 ENCOUNTER — Encounter: Payer: Self-pay | Admitting: *Deleted

## 2016-10-07 DIAGNOSIS — L600 Ingrowing nail: Secondary | ICD-10-CM | POA: Diagnosis not present

## 2016-10-07 DIAGNOSIS — E119 Type 2 diabetes mellitus without complications: Secondary | ICD-10-CM | POA: Diagnosis not present

## 2016-10-23 DIAGNOSIS — C44321 Squamous cell carcinoma of skin of nose: Secondary | ICD-10-CM | POA: Diagnosis not present

## 2016-10-23 DIAGNOSIS — D1801 Hemangioma of skin and subcutaneous tissue: Secondary | ICD-10-CM | POA: Diagnosis not present

## 2016-10-23 DIAGNOSIS — L821 Other seborrheic keratosis: Secondary | ICD-10-CM | POA: Diagnosis not present

## 2016-12-16 DIAGNOSIS — E119 Type 2 diabetes mellitus without complications: Secondary | ICD-10-CM | POA: Diagnosis not present

## 2016-12-16 DIAGNOSIS — I10 Essential (primary) hypertension: Secondary | ICD-10-CM | POA: Diagnosis not present

## 2016-12-16 DIAGNOSIS — F329 Major depressive disorder, single episode, unspecified: Secondary | ICD-10-CM | POA: Diagnosis not present

## 2016-12-16 DIAGNOSIS — E785 Hyperlipidemia, unspecified: Secondary | ICD-10-CM | POA: Diagnosis not present

## 2016-12-16 DIAGNOSIS — F419 Anxiety disorder, unspecified: Secondary | ICD-10-CM | POA: Diagnosis not present

## 2016-12-16 DIAGNOSIS — E669 Obesity, unspecified: Secondary | ICD-10-CM | POA: Diagnosis not present

## 2016-12-16 DIAGNOSIS — Z6834 Body mass index (BMI) 34.0-34.9, adult: Secondary | ICD-10-CM | POA: Diagnosis not present

## 2016-12-16 DIAGNOSIS — C61 Malignant neoplasm of prostate: Secondary | ICD-10-CM | POA: Diagnosis not present

## 2016-12-16 DIAGNOSIS — K219 Gastro-esophageal reflux disease without esophagitis: Secondary | ICD-10-CM | POA: Diagnosis not present

## 2016-12-16 DIAGNOSIS — E1121 Type 2 diabetes mellitus with diabetic nephropathy: Secondary | ICD-10-CM | POA: Diagnosis not present

## 2017-01-12 DIAGNOSIS — E119 Type 2 diabetes mellitus without complications: Secondary | ICD-10-CM | POA: Diagnosis not present

## 2017-01-12 DIAGNOSIS — E785 Hyperlipidemia, unspecified: Secondary | ICD-10-CM | POA: Diagnosis not present

## 2017-01-12 DIAGNOSIS — N289 Disorder of kidney and ureter, unspecified: Secondary | ICD-10-CM | POA: Diagnosis not present

## 2017-01-12 DIAGNOSIS — I10 Essential (primary) hypertension: Secondary | ICD-10-CM | POA: Diagnosis not present

## 2017-01-12 DIAGNOSIS — C61 Malignant neoplasm of prostate: Secondary | ICD-10-CM | POA: Diagnosis not present

## 2017-01-12 DIAGNOSIS — F329 Major depressive disorder, single episode, unspecified: Secondary | ICD-10-CM | POA: Diagnosis not present

## 2017-01-12 DIAGNOSIS — E1121 Type 2 diabetes mellitus with diabetic nephropathy: Secondary | ICD-10-CM | POA: Diagnosis not present

## 2017-01-12 DIAGNOSIS — K219 Gastro-esophageal reflux disease without esophagitis: Secondary | ICD-10-CM | POA: Diagnosis not present

## 2017-01-12 DIAGNOSIS — F419 Anxiety disorder, unspecified: Secondary | ICD-10-CM | POA: Diagnosis not present

## 2017-01-12 DIAGNOSIS — Z6834 Body mass index (BMI) 34.0-34.9, adult: Secondary | ICD-10-CM | POA: Diagnosis not present

## 2017-03-10 DIAGNOSIS — C61 Malignant neoplasm of prostate: Secondary | ICD-10-CM | POA: Diagnosis not present

## 2017-03-17 DIAGNOSIS — Z8546 Personal history of malignant neoplasm of prostate: Secondary | ICD-10-CM | POA: Diagnosis not present

## 2017-03-23 DIAGNOSIS — N289 Disorder of kidney and ureter, unspecified: Secondary | ICD-10-CM | POA: Diagnosis not present

## 2017-03-23 DIAGNOSIS — I10 Essential (primary) hypertension: Secondary | ICD-10-CM | POA: Diagnosis not present

## 2017-03-23 DIAGNOSIS — E785 Hyperlipidemia, unspecified: Secondary | ICD-10-CM | POA: Diagnosis not present

## 2017-03-23 DIAGNOSIS — F329 Major depressive disorder, single episode, unspecified: Secondary | ICD-10-CM | POA: Diagnosis not present

## 2017-03-23 DIAGNOSIS — F419 Anxiety disorder, unspecified: Secondary | ICD-10-CM | POA: Diagnosis not present

## 2017-03-23 DIAGNOSIS — K219 Gastro-esophageal reflux disease without esophagitis: Secondary | ICD-10-CM | POA: Diagnosis not present

## 2017-03-23 DIAGNOSIS — E1121 Type 2 diabetes mellitus with diabetic nephropathy: Secondary | ICD-10-CM | POA: Diagnosis not present

## 2017-03-23 DIAGNOSIS — C61 Malignant neoplasm of prostate: Secondary | ICD-10-CM | POA: Diagnosis not present

## 2017-03-23 DIAGNOSIS — E119 Type 2 diabetes mellitus without complications: Secondary | ICD-10-CM | POA: Diagnosis not present

## 2017-03-23 DIAGNOSIS — Z1389 Encounter for screening for other disorder: Secondary | ICD-10-CM | POA: Diagnosis not present

## 2017-03-23 DIAGNOSIS — Z6834 Body mass index (BMI) 34.0-34.9, adult: Secondary | ICD-10-CM | POA: Diagnosis not present

## 2017-03-24 DIAGNOSIS — M25561 Pain in right knee: Secondary | ICD-10-CM | POA: Diagnosis not present

## 2017-03-24 DIAGNOSIS — M1711 Unilateral primary osteoarthritis, right knee: Secondary | ICD-10-CM | POA: Diagnosis not present

## 2017-04-23 DIAGNOSIS — E119 Type 2 diabetes mellitus without complications: Secondary | ICD-10-CM | POA: Diagnosis not present

## 2017-04-23 DIAGNOSIS — N289 Disorder of kidney and ureter, unspecified: Secondary | ICD-10-CM | POA: Diagnosis not present

## 2017-04-23 DIAGNOSIS — Z6834 Body mass index (BMI) 34.0-34.9, adult: Secondary | ICD-10-CM | POA: Diagnosis not present

## 2017-04-23 DIAGNOSIS — C61 Malignant neoplasm of prostate: Secondary | ICD-10-CM | POA: Diagnosis not present

## 2017-04-23 DIAGNOSIS — E1121 Type 2 diabetes mellitus with diabetic nephropathy: Secondary | ICD-10-CM | POA: Diagnosis not present

## 2017-04-23 DIAGNOSIS — F329 Major depressive disorder, single episode, unspecified: Secondary | ICD-10-CM | POA: Diagnosis not present

## 2017-04-23 DIAGNOSIS — F419 Anxiety disorder, unspecified: Secondary | ICD-10-CM | POA: Diagnosis not present

## 2017-04-23 DIAGNOSIS — I10 Essential (primary) hypertension: Secondary | ICD-10-CM | POA: Diagnosis not present

## 2017-04-23 DIAGNOSIS — E785 Hyperlipidemia, unspecified: Secondary | ICD-10-CM | POA: Diagnosis not present

## 2017-04-23 DIAGNOSIS — K219 Gastro-esophageal reflux disease without esophagitis: Secondary | ICD-10-CM | POA: Diagnosis not present

## 2017-04-27 DIAGNOSIS — H6983 Other specified disorders of Eustachian tube, bilateral: Secondary | ICD-10-CM | POA: Diagnosis not present

## 2017-04-27 DIAGNOSIS — Z9622 Myringotomy tube(s) status: Secondary | ICD-10-CM | POA: Diagnosis not present

## 2017-04-27 DIAGNOSIS — H9190 Unspecified hearing loss, unspecified ear: Secondary | ICD-10-CM | POA: Diagnosis not present

## 2017-05-04 DIAGNOSIS — Z9622 Myringotomy tube(s) status: Secondary | ICD-10-CM | POA: Diagnosis not present

## 2017-05-04 DIAGNOSIS — H9190 Unspecified hearing loss, unspecified ear: Secondary | ICD-10-CM | POA: Diagnosis not present

## 2017-05-04 DIAGNOSIS — H6983 Other specified disorders of Eustachian tube, bilateral: Secondary | ICD-10-CM | POA: Diagnosis not present

## 2017-05-04 DIAGNOSIS — H9193 Unspecified hearing loss, bilateral: Secondary | ICD-10-CM | POA: Diagnosis not present

## 2017-05-05 DIAGNOSIS — M1712 Unilateral primary osteoarthritis, left knee: Secondary | ICD-10-CM | POA: Diagnosis not present

## 2017-05-05 DIAGNOSIS — M1711 Unilateral primary osteoarthritis, right knee: Secondary | ICD-10-CM | POA: Diagnosis not present

## 2017-05-19 DIAGNOSIS — M1712 Unilateral primary osteoarthritis, left knee: Secondary | ICD-10-CM | POA: Diagnosis not present

## 2017-05-28 DIAGNOSIS — M1711 Unilateral primary osteoarthritis, right knee: Secondary | ICD-10-CM | POA: Diagnosis not present

## 2017-06-23 DIAGNOSIS — N289 Disorder of kidney and ureter, unspecified: Secondary | ICD-10-CM | POA: Diagnosis not present

## 2017-06-23 DIAGNOSIS — E119 Type 2 diabetes mellitus without complications: Secondary | ICD-10-CM | POA: Diagnosis not present

## 2017-06-23 DIAGNOSIS — K219 Gastro-esophageal reflux disease without esophagitis: Secondary | ICD-10-CM | POA: Diagnosis not present

## 2017-06-23 DIAGNOSIS — E785 Hyperlipidemia, unspecified: Secondary | ICD-10-CM | POA: Diagnosis not present

## 2017-06-23 DIAGNOSIS — E1121 Type 2 diabetes mellitus with diabetic nephropathy: Secondary | ICD-10-CM | POA: Diagnosis not present

## 2017-06-23 DIAGNOSIS — F329 Major depressive disorder, single episode, unspecified: Secondary | ICD-10-CM | POA: Diagnosis not present

## 2017-06-23 DIAGNOSIS — I10 Essential (primary) hypertension: Secondary | ICD-10-CM | POA: Diagnosis not present

## 2017-06-23 DIAGNOSIS — C61 Malignant neoplasm of prostate: Secondary | ICD-10-CM | POA: Diagnosis not present

## 2017-06-23 DIAGNOSIS — E669 Obesity, unspecified: Secondary | ICD-10-CM | POA: Diagnosis not present

## 2017-06-23 DIAGNOSIS — Z9181 History of falling: Secondary | ICD-10-CM | POA: Diagnosis not present

## 2017-06-23 DIAGNOSIS — F419 Anxiety disorder, unspecified: Secondary | ICD-10-CM | POA: Diagnosis not present

## 2017-06-23 DIAGNOSIS — Z6834 Body mass index (BMI) 34.0-34.9, adult: Secondary | ICD-10-CM | POA: Diagnosis not present

## 2017-07-01 DIAGNOSIS — C4441 Basal cell carcinoma of skin of scalp and neck: Secondary | ICD-10-CM | POA: Diagnosis not present

## 2017-07-01 DIAGNOSIS — L821 Other seborrheic keratosis: Secondary | ICD-10-CM | POA: Diagnosis not present

## 2017-07-26 DIAGNOSIS — E119 Type 2 diabetes mellitus without complications: Secondary | ICD-10-CM | POA: Diagnosis not present

## 2017-08-05 DIAGNOSIS — H9193 Unspecified hearing loss, bilateral: Secondary | ICD-10-CM | POA: Diagnosis not present

## 2017-08-05 DIAGNOSIS — J300 Vasomotor rhinitis: Secondary | ICD-10-CM | POA: Diagnosis not present

## 2017-08-05 DIAGNOSIS — H6983 Other specified disorders of Eustachian tube, bilateral: Secondary | ICD-10-CM | POA: Diagnosis not present

## 2017-08-10 DIAGNOSIS — I10 Essential (primary) hypertension: Secondary | ICD-10-CM | POA: Diagnosis not present

## 2017-08-10 DIAGNOSIS — Z23 Encounter for immunization: Secondary | ICD-10-CM | POA: Diagnosis not present

## 2017-08-10 DIAGNOSIS — E785 Hyperlipidemia, unspecified: Secondary | ICD-10-CM | POA: Diagnosis not present

## 2017-08-10 DIAGNOSIS — N289 Disorder of kidney and ureter, unspecified: Secondary | ICD-10-CM | POA: Diagnosis not present

## 2017-08-10 DIAGNOSIS — K219 Gastro-esophageal reflux disease without esophagitis: Secondary | ICD-10-CM | POA: Diagnosis not present

## 2017-08-10 DIAGNOSIS — F329 Major depressive disorder, single episode, unspecified: Secondary | ICD-10-CM | POA: Diagnosis not present

## 2017-08-10 DIAGNOSIS — F419 Anxiety disorder, unspecified: Secondary | ICD-10-CM | POA: Diagnosis not present

## 2017-08-10 DIAGNOSIS — E119 Type 2 diabetes mellitus without complications: Secondary | ICD-10-CM | POA: Diagnosis not present

## 2017-08-10 DIAGNOSIS — E1121 Type 2 diabetes mellitus with diabetic nephropathy: Secondary | ICD-10-CM | POA: Diagnosis not present

## 2017-08-10 DIAGNOSIS — C61 Malignant neoplasm of prostate: Secondary | ICD-10-CM | POA: Diagnosis not present

## 2017-08-10 DIAGNOSIS — Z6834 Body mass index (BMI) 34.0-34.9, adult: Secondary | ICD-10-CM | POA: Diagnosis not present

## 2017-10-01 DIAGNOSIS — Z1211 Encounter for screening for malignant neoplasm of colon: Secondary | ICD-10-CM | POA: Diagnosis not present

## 2017-10-01 DIAGNOSIS — Z6834 Body mass index (BMI) 34.0-34.9, adult: Secondary | ICD-10-CM | POA: Diagnosis not present

## 2017-10-01 DIAGNOSIS — E669 Obesity, unspecified: Secondary | ICD-10-CM | POA: Diagnosis not present

## 2017-10-01 DIAGNOSIS — Z23 Encounter for immunization: Secondary | ICD-10-CM | POA: Diagnosis not present

## 2017-10-01 DIAGNOSIS — E785 Hyperlipidemia, unspecified: Secondary | ICD-10-CM | POA: Diagnosis not present

## 2017-10-01 DIAGNOSIS — Z0000 Encounter for general adult medical examination without abnormal findings: Secondary | ICD-10-CM | POA: Diagnosis not present

## 2017-10-01 DIAGNOSIS — Z125 Encounter for screening for malignant neoplasm of prostate: Secondary | ICD-10-CM | POA: Diagnosis not present

## 2017-10-01 DIAGNOSIS — Z9181 History of falling: Secondary | ICD-10-CM | POA: Diagnosis not present

## 2017-10-05 DIAGNOSIS — L578 Other skin changes due to chronic exposure to nonionizing radiation: Secondary | ICD-10-CM | POA: Diagnosis not present

## 2017-10-05 DIAGNOSIS — C4441 Basal cell carcinoma of skin of scalp and neck: Secondary | ICD-10-CM | POA: Diagnosis not present

## 2017-10-05 DIAGNOSIS — L821 Other seborrheic keratosis: Secondary | ICD-10-CM | POA: Diagnosis not present

## 2017-10-05 DIAGNOSIS — L820 Inflamed seborrheic keratosis: Secondary | ICD-10-CM | POA: Diagnosis not present

## 2017-10-20 DIAGNOSIS — E119 Type 2 diabetes mellitus without complications: Secondary | ICD-10-CM | POA: Diagnosis not present

## 2017-10-20 DIAGNOSIS — Z6833 Body mass index (BMI) 33.0-33.9, adult: Secondary | ICD-10-CM | POA: Diagnosis not present

## 2017-10-20 DIAGNOSIS — F329 Major depressive disorder, single episode, unspecified: Secondary | ICD-10-CM | POA: Diagnosis not present

## 2017-10-20 DIAGNOSIS — C61 Malignant neoplasm of prostate: Secondary | ICD-10-CM | POA: Diagnosis not present

## 2017-10-20 DIAGNOSIS — F419 Anxiety disorder, unspecified: Secondary | ICD-10-CM | POA: Diagnosis not present

## 2017-10-20 DIAGNOSIS — K219 Gastro-esophageal reflux disease without esophagitis: Secondary | ICD-10-CM | POA: Diagnosis not present

## 2017-10-20 DIAGNOSIS — I10 Essential (primary) hypertension: Secondary | ICD-10-CM | POA: Diagnosis not present

## 2017-10-20 DIAGNOSIS — E785 Hyperlipidemia, unspecified: Secondary | ICD-10-CM | POA: Diagnosis not present

## 2017-10-20 DIAGNOSIS — E1121 Type 2 diabetes mellitus with diabetic nephropathy: Secondary | ICD-10-CM | POA: Diagnosis not present

## 2017-10-20 DIAGNOSIS — N289 Disorder of kidney and ureter, unspecified: Secondary | ICD-10-CM | POA: Diagnosis not present

## 2017-12-20 DIAGNOSIS — F419 Anxiety disorder, unspecified: Secondary | ICD-10-CM | POA: Diagnosis not present

## 2017-12-20 DIAGNOSIS — C61 Malignant neoplasm of prostate: Secondary | ICD-10-CM | POA: Diagnosis not present

## 2017-12-20 DIAGNOSIS — E1121 Type 2 diabetes mellitus with diabetic nephropathy: Secondary | ICD-10-CM | POA: Diagnosis not present

## 2017-12-20 DIAGNOSIS — F329 Major depressive disorder, single episode, unspecified: Secondary | ICD-10-CM | POA: Diagnosis not present

## 2017-12-20 DIAGNOSIS — K219 Gastro-esophageal reflux disease without esophagitis: Secondary | ICD-10-CM | POA: Diagnosis not present

## 2017-12-20 DIAGNOSIS — E119 Type 2 diabetes mellitus without complications: Secondary | ICD-10-CM | POA: Diagnosis not present

## 2017-12-20 DIAGNOSIS — I10 Essential (primary) hypertension: Secondary | ICD-10-CM | POA: Diagnosis not present

## 2017-12-20 DIAGNOSIS — E785 Hyperlipidemia, unspecified: Secondary | ICD-10-CM | POA: Diagnosis not present

## 2017-12-20 DIAGNOSIS — N289 Disorder of kidney and ureter, unspecified: Secondary | ICD-10-CM | POA: Diagnosis not present

## 2017-12-20 DIAGNOSIS — Z6833 Body mass index (BMI) 33.0-33.9, adult: Secondary | ICD-10-CM | POA: Diagnosis not present

## 2017-12-21 DIAGNOSIS — H6983 Other specified disorders of Eustachian tube, bilateral: Secondary | ICD-10-CM | POA: Diagnosis not present

## 2017-12-21 DIAGNOSIS — H6123 Impacted cerumen, bilateral: Secondary | ICD-10-CM | POA: Diagnosis not present

## 2018-01-25 DIAGNOSIS — H6980 Other specified disorders of Eustachian tube, unspecified ear: Secondary | ICD-10-CM | POA: Diagnosis not present

## 2018-01-25 DIAGNOSIS — H8091 Unspecified otosclerosis, right ear: Secondary | ICD-10-CM | POA: Diagnosis not present

## 2018-01-25 DIAGNOSIS — H906 Mixed conductive and sensorineural hearing loss, bilateral: Secondary | ICD-10-CM | POA: Diagnosis not present

## 2018-02-22 DIAGNOSIS — E785 Hyperlipidemia, unspecified: Secondary | ICD-10-CM | POA: Diagnosis not present

## 2018-02-22 DIAGNOSIS — E1121 Type 2 diabetes mellitus with diabetic nephropathy: Secondary | ICD-10-CM | POA: Diagnosis not present

## 2018-02-22 DIAGNOSIS — K219 Gastro-esophageal reflux disease without esophagitis: Secondary | ICD-10-CM | POA: Diagnosis not present

## 2018-02-22 DIAGNOSIS — E119 Type 2 diabetes mellitus without complications: Secondary | ICD-10-CM | POA: Diagnosis not present

## 2018-02-22 DIAGNOSIS — I10 Essential (primary) hypertension: Secondary | ICD-10-CM | POA: Diagnosis not present

## 2018-03-17 DIAGNOSIS — C61 Malignant neoplasm of prostate: Secondary | ICD-10-CM | POA: Diagnosis not present

## 2018-03-24 DIAGNOSIS — N401 Enlarged prostate with lower urinary tract symptoms: Secondary | ICD-10-CM | POA: Diagnosis not present

## 2018-03-24 DIAGNOSIS — R3912 Poor urinary stream: Secondary | ICD-10-CM | POA: Diagnosis not present

## 2018-03-24 DIAGNOSIS — Z8546 Personal history of malignant neoplasm of prostate: Secondary | ICD-10-CM | POA: Diagnosis not present

## 2018-04-27 DIAGNOSIS — I10 Essential (primary) hypertension: Secondary | ICD-10-CM | POA: Diagnosis not present

## 2018-04-27 DIAGNOSIS — E119 Type 2 diabetes mellitus without complications: Secondary | ICD-10-CM | POA: Diagnosis not present

## 2018-04-27 DIAGNOSIS — E1121 Type 2 diabetes mellitus with diabetic nephropathy: Secondary | ICD-10-CM | POA: Diagnosis not present

## 2018-04-27 DIAGNOSIS — K219 Gastro-esophageal reflux disease without esophagitis: Secondary | ICD-10-CM | POA: Diagnosis not present

## 2018-04-27 DIAGNOSIS — F329 Major depressive disorder, single episode, unspecified: Secondary | ICD-10-CM | POA: Diagnosis not present

## 2018-04-27 DIAGNOSIS — Z1339 Encounter for screening examination for other mental health and behavioral disorders: Secondary | ICD-10-CM | POA: Diagnosis not present

## 2018-05-24 DIAGNOSIS — G8929 Other chronic pain: Secondary | ICD-10-CM | POA: Diagnosis not present

## 2018-05-24 DIAGNOSIS — M170 Bilateral primary osteoarthritis of knee: Secondary | ICD-10-CM | POA: Diagnosis not present

## 2018-06-03 DIAGNOSIS — F329 Major depressive disorder, single episode, unspecified: Secondary | ICD-10-CM | POA: Diagnosis not present

## 2018-06-03 DIAGNOSIS — K219 Gastro-esophageal reflux disease without esophagitis: Secondary | ICD-10-CM | POA: Diagnosis not present

## 2018-06-03 DIAGNOSIS — E119 Type 2 diabetes mellitus without complications: Secondary | ICD-10-CM | POA: Diagnosis not present

## 2018-06-03 DIAGNOSIS — Z01810 Encounter for preprocedural cardiovascular examination: Secondary | ICD-10-CM | POA: Diagnosis not present

## 2018-06-03 DIAGNOSIS — Z1339 Encounter for screening examination for other mental health and behavioral disorders: Secondary | ICD-10-CM | POA: Diagnosis not present

## 2018-06-20 ENCOUNTER — Other Ambulatory Visit: Payer: Self-pay | Admitting: Orthopedic Surgery

## 2018-06-24 NOTE — Patient Instructions (Addendum)
Timothy Wolfe  06/24/2018   Your procedure is scheduled on: Monday 07/04/2018  Report to [facility] Main  Entrance              Report to admitting at  [place]  AM    Call this number if you have problems the morning of surgery [phone]  How to Manage Your Diabetes Before and After Surgery  Why is it important to control my blood sugar before and after surgery? . Improving blood sugar levels before and after surgery helps healing and can limit problems. . A way of improving blood sugar control is eating a healthy diet by: o  Eating less sugar and carbohydrates o  Increasing activity/exercise o  Talking with your doctor about reaching your blood sugar goals . High blood sugars (greater than 180 mg/dL) can raise your risk of infections and slow your recovery, so you will need to focus on controlling your diabetes during the weeks before surgery. . Make sure that the doctor who takes care of your diabetes knows about your planned surgery including the date and location.  How do I manage my blood sugar before surgery? . Check your blood sugar at least 4 times a day, starting 2 days before surgery, to make sure that the level is not too high or low. o Check your blood sugar the morning of your surgery when you wake up and every 2 hours until you get to the Short Stay unit. . If your blood sugar is less than 70 mg/dL, you will need to treat for low blood sugar: o Do not take insulin. o Treat a low blood sugar (less than 70 mg/dL) with  cup of clear juice (cranberry or apple), 4 glucose tablets, OR glucose gel. o Recheck blood sugar in 15 minutes after treatment (to make sure it is greater than 70 mg/dL). If your blood sugar is not greater than 70 mg/dL on recheck, call [phone] for further instructions. . Report your blood sugar to the short stay nurse when you get to Short Stay.  . If you are admitted to the hospital after surgery: o Your blood sugar will be  checked by the staff and you will probably be given insulin after surgery (instead of oral diabetes medicines) to make sure you have good blood sugar levels. o The goal for blood sugar control after surgery is 80-180 mg/dL.   WHAT DO I DO ABOUT MY DIABETES MEDICATION?       The day before surgery, take Glimepiride as usual.        The day beforre surgery, Take Victoza as usual,  . Do not take oral diabetes medicines (pills) the morning of surgery.  . THE NIGHT BEFORE SURGERY, take  12    units of  Levemir     insulin.       . THE MORNING OF SURGERY, take  12   units of   Levemir       insulin.  . The day of surgery, do not take other diabetes injectables, including Byetta (exenatide), Bydureon (exenatide ER), Victoza (liraglutide), or Trulicity (dulaglutide).  . If your CBG is greater than 220 mg/dL, you may take  of your sliding scale  . (correction) dose of insulin.      Remember: Do not eat food or drink liquids :After Midnight.   BRUSH YOUR TEETH MORNING OF SURGERY AND RINSE YOUR MOUTH OUT,  NO CHEWING GUM CANDY OR MINTS.     Take these medicines the morning of surgery with A SIP OF WATER: Hydralazine (Apresoline), Metoprolol tartrate (Lopressor), Clonidine (Catapress), Ranitidine (Zantac)   DO NOT TAKE ANY DIABETIC MEDICATIONS DAY OF YOUR SURGERY!                               You may not have any metal on your body including hair pins and              piercings  Do not wear jewelry, make-up, lotions, powders or perfumes, deodorant                          Men may shave face and neck.   Do not bring valuables to the hospital. [place].  Contacts, dentures or bridgework may not be worn into surgery.  Leave suitcase in the car. After surgery it may be brought to your room.                  Please read over the following fact sheets you were  given: _____________________________________________________________________             [name] - Preparing for Surgery Before surgery, you can play an important role.  Because skin is not sterile, your skin needs to be as free of germs as possible.  You can reduce the number of germs on your skin by washing with CHG (chlorahexidine gluconate) soap before surgery.  CHG is an antiseptic cleaner which kills germs and bonds with the skin to continue killing germs even after washing. Please DO NOT use if you have an allergy to CHG or antibacterial soaps.  If your skin becomes reddened/irritated stop using the CHG and inform your nurse when you arrive at Short Stay. Do not shave (including legs and underarms) for at least 48 hours prior to the first CHG shower.  You may shave your face/neck. Please follow these instructions carefully:  1.  Shower with CHG Soap the night before surgery and the  morning of Surgery.  2.  If you choose to wash your hair, wash your hair first as usual with your  normal  shampoo.  3.  After you shampoo, rinse your hair and body thoroughly to remove the  shampoo.                           4.  Use CHG as you would any other liquid soap.  You can apply chg directly  to the skin and wash                       Gently with a scrungie or clean washcloth.  5.  Apply the CHG Soap to your body ONLY FROM THE NECK DOWN.   Do not use on face/ open                           Wound or open sores. Avoid contact with eyes, ears mouth and genitals (private parts).                       Wash face,  Genitals (private parts) with  your normal soap.             6.  Wash thoroughly, paying special attention to the area where your surgery  will be performed.  7.  Thoroughly rinse your body with warm water from the neck down.  8.  DO NOT shower/wash with your normal soap after using and rinsing off  the CHG Soap.                9.  Pat yourself dry with a clean towel.            10.  Wear  clean pajamas.            11.  Place clean sheets on your bed the night of your first shower and do not  sleep with pets. Day of Surgery : Do not apply any lotions/deodorants the morning of surgery.  Please wear clean clothes to the hospital/surgery center.  FAILURE TO FOLLOW THESE INSTRUCTIONS MAY RESULT IN THE CANCELLATION OF YOUR SURGERY PATIENT SIGNATURE_________________________________  NURSE SIGNATURE__________________________________  ________________________________________________________________________   Timothy Wolfe  An incentive spirometer is a tool that can help keep your lungs clear and active. This tool measures how well you are filling your lungs with each breath. Taking long deep breaths may help reverse or decrease the chance of developing breathing (pulmonary) problems (especially infection) following:  A long period of time when you are unable to move or be active. BEFORE THE PROCEDURE   If the spirometer includes an indicator to show your best effort, your nurse or respiratory therapist will set it to a desired goal.  If possible, sit up straight or lean slightly forward. Try not to slouch.  Hold the incentive spirometer in an upright position. INSTRUCTIONS FOR USE  1. Sit on the edge of your bed if possible, or sit up as far as you can in bed or on a chair. 2. Hold the incentive spirometer in an upright position. 3. Breathe out normally. 4. Place the mouthpiece in your mouth and seal your lips tightly around it. 5. Breathe in slowly and as deeply as possible, raising the piston or the ball toward the top of the column. 6. Hold your breath for 3-5 seconds or for as long as possible. Allow the piston or ball to fall to the bottom of the column. 7. Remove the mouthpiece from your mouth and breathe out normally. 8. Rest for a few seconds and repeat Steps 1 through 7 at least 10 times every 1-2 hours when you are awake. Take your time and take a few normal  breaths between deep breaths. 9. The spirometer may include an indicator to show your best effort. Use the indicator as a goal to work toward during each repetition. 10. After each set of 10 deep breaths, practice coughing to be sure your lungs are clear. If you have an incision (the cut made at the time of surgery), support your incision when coughing by placing a pillow or rolled up towels firmly against it. Once you are able to get out of bed, walk around indoors and cough well. You may stop using the incentive spirometer when instructed by your caregiver.  RISKS AND COMPLICATIONS  Take your time so you do not get dizzy or light-headed.  If you are in pain, you may need to take or ask for pain medication before doing incentive spirometry. It is harder to take a deep breath if you are having pain. AFTER USE  Rest and breathe  slowly and easily.  It can be helpful to keep track of a log of your progress. Your caregiver can provide you with a simple table to help with this. If you are using the spirometer at home, follow these instructions: [place] IF:   You are having difficultly using the spirometer.  You have trouble using the spirometer as often as instructed.  Your pain medication is not giving enough relief while using the spirometer.  You develop fever of 100.5 F (38.1 C) or higher. SEEK IMMEDIATE MEDICAL CARE IF:   You cough up bloody sputum that had not been present before.  You develop fever of 102 F (38.9 C) or greater.  You develop worsening pain at or near the incision site. MAKE SURE YOU:   Understand these instructions.  Will watch your condition.  Will get help right away if you are not doing well or get worse. Document Released: 01/25/2007 Document Revised: 12/07/2011 Document Reviewed: 03/28/2007 [facility] Patient Information 2014 [place], [place].   ________________________________________________________________________

## 2018-06-27 ENCOUNTER — Encounter (HOSPITAL_COMMUNITY)
Admission: RE | Admit: 2018-06-27 | Discharge: 2018-06-27 | Disposition: A | Discharge status: Home / Self Care | Payer: Medicare Other | Source: Ambulatory Visit | Attending: Orthopedic Surgery | Admitting: Orthopedic Surgery

## 2018-06-27 ENCOUNTER — Other Ambulatory Visit: Payer: Self-pay

## 2018-06-27 ENCOUNTER — Encounter (HOSPITAL_COMMUNITY): Payer: Self-pay

## 2018-06-27 DIAGNOSIS — M1712 Unilateral primary osteoarthritis, left knee: Secondary | ICD-10-CM | POA: Insufficient documentation

## 2018-06-27 DIAGNOSIS — I10 Essential (primary) hypertension: Secondary | ICD-10-CM | POA: Diagnosis not present

## 2018-06-27 DIAGNOSIS — E119 Type 2 diabetes mellitus without complications: Secondary | ICD-10-CM | POA: Diagnosis not present

## 2018-06-27 DIAGNOSIS — Z01818 Encounter for other preprocedural examination: Secondary | ICD-10-CM | POA: Insufficient documentation

## 2018-06-27 HISTORY — DX: Malignant (primary) neoplasm, unspecified: C80.1

## 2018-06-27 LAB — CBC WITH DIFFERENTIAL/PLATELET
BASOS PCT: 1 %
Basophils Absolute: 0.1 10*3/uL (ref 0.0–0.1)
Eosinophils Absolute: 0.3 10*3/uL (ref 0.0–0.7)
Eosinophils Relative: 3 %
HEMATOCRIT: 47.4 % (ref 39.0–52.0)
HEMOGLOBIN: 16 g/dL (ref 13.0–17.0)
LYMPHS ABS: 1.9 10*3/uL (ref 0.7–4.0)
Lymphocytes Relative: 19 %
MCH: 30.7 pg (ref 26.0–34.0)
MCHC: 33.8 g/dL (ref 30.0–36.0)
MCV: 91 fL (ref 78.0–100.0)
MONOS PCT: 9 %
Monocytes Absolute: 0.9 10*3/uL (ref 0.1–1.0)
NEUTROS ABS: 7.3 10*3/uL (ref 1.7–7.7)
Neutrophils Relative %: 68 %
Platelets: 206 10*3/uL (ref 150–400)
RBC: 5.21 MIL/uL (ref 4.22–5.81)
RDW: 12.8 % (ref 11.5–15.5)
WBC: 10.5 10*3/uL (ref 4.0–10.5)

## 2018-06-27 LAB — COMPREHENSIVE METABOLIC PANEL
ALBUMIN: 4 g/dL (ref 3.5–5.0)
ALT: 25 U/L (ref 0–44)
ANION GAP: 10 (ref 5–15)
AST: 21 U/L (ref 15–41)
Alkaline Phosphatase: 59 U/L (ref 38–126)
BUN: 29 mg/dL — AB (ref 8–23)
CO2: 24 mmol/L (ref 22–32)
Calcium: 9.5 mg/dL (ref 8.9–10.3)
Chloride: 109 mmol/L (ref 98–111)
Creatinine, Ser: 1.36 mg/dL — ABNORMAL HIGH (ref 0.61–1.24)
GFR calc Af Amer: >60 mL/min (ref >60)
GFR calc non Af Amer: 52 mL/min — ABNORMAL LOW (ref >60)
GLUCOSE: 78 mg/dL (ref 70–99)
Potassium: 3.5 mmol/L (ref 3.5–5.1)
Sodium: 143 mmol/L (ref 135–145)
TOTAL PROTEIN: 7.3 g/dL (ref 6.5–8.1)
Total Bilirubin: 0.9 mg/dL (ref 0.3–1.2)

## 2018-06-27 LAB — SURGICAL PCR SCREEN
MRSA, PCR: NEGATIVE
STAPHYLOCOCCUS AUREUS: NEGATIVE

## 2018-06-27 LAB — GLUCOSE, CAPILLARY: Glucose-Capillary: 96 mg/dL (ref 70–99)

## 2018-06-27 NOTE — Progress Notes (Signed)
06/03/2018- on chart EKG from Dr. Cher Nakai.

## 2018-06-27 NOTE — Progress Notes (Signed)
Chart left with Zelphia Cairo, RN to follow up on lab results!

## 2018-06-28 DIAGNOSIS — C61 Malignant neoplasm of prostate: Secondary | ICD-10-CM | POA: Diagnosis not present

## 2018-06-28 DIAGNOSIS — E785 Hyperlipidemia, unspecified: Secondary | ICD-10-CM | POA: Diagnosis not present

## 2018-06-28 DIAGNOSIS — E119 Type 2 diabetes mellitus without complications: Secondary | ICD-10-CM | POA: Diagnosis not present

## 2018-06-28 DIAGNOSIS — F329 Major depressive disorder, single episode, unspecified: Secondary | ICD-10-CM | POA: Diagnosis not present

## 2018-06-28 DIAGNOSIS — K219 Gastro-esophageal reflux disease without esophagitis: Secondary | ICD-10-CM | POA: Diagnosis not present

## 2018-06-28 LAB — HEMOGLOBIN A1C
Hgb A1c MFr Bld: 5.7 % — ABNORMAL HIGH (ref 4.8–5.6)
MEAN PLASMA GLUCOSE: 117 mg/dL

## 2018-07-01 NOTE — Progress Notes (Signed)
Patient notified of time change for surgery. Patient instructed to to arrive at Admitting on Monday 07/04/2018 at 0830 am and nothing to eat or drink after midnight! Patient verbalized understanding!

## 2018-07-03 MED ORDER — BUPIVACAINE LIPOSOME 1.3 % IJ SUSP
20.0000 mL | Freq: Once | INTRAMUSCULAR | Status: DC
  Filled 2018-07-03: qty 20

## 2018-07-04 ENCOUNTER — Ambulatory Visit (HOSPITAL_COMMUNITY): Payer: Medicare Other | Admitting: Anesthesiology

## 2018-07-04 ENCOUNTER — Encounter (HOSPITAL_COMMUNITY)
Admission: RE | Disposition: A | Discharge status: Home / Self Care | Payer: Self-pay | Source: Ambulatory Visit | Attending: Orthopedic Surgery

## 2018-07-04 ENCOUNTER — Observation Stay (HOSPITAL_COMMUNITY)
Admission: RE | Admit: 2018-07-04 | Discharge: 2018-07-05 | Disposition: A | Discharge status: Home / Self Care | Payer: Medicare Other | Source: Ambulatory Visit | Attending: Orthopedic Surgery | Admitting: Orthopedic Surgery

## 2018-07-04 ENCOUNTER — Encounter (HOSPITAL_COMMUNITY): Payer: Self-pay | Admitting: *Deleted

## 2018-07-04 ENCOUNTER — Other Ambulatory Visit: Payer: Self-pay

## 2018-07-04 DIAGNOSIS — Z87891 Personal history of nicotine dependence: Secondary | ICD-10-CM | POA: Diagnosis not present

## 2018-07-04 DIAGNOSIS — E119 Type 2 diabetes mellitus without complications: Secondary | ICD-10-CM | POA: Diagnosis not present

## 2018-07-04 DIAGNOSIS — Z794 Long term (current) use of insulin: Secondary | ICD-10-CM | POA: Diagnosis not present

## 2018-07-04 DIAGNOSIS — M1712 Unilateral primary osteoarthritis, left knee: Secondary | ICD-10-CM | POA: Diagnosis not present

## 2018-07-04 DIAGNOSIS — I10 Essential (primary) hypertension: Secondary | ICD-10-CM | POA: Diagnosis not present

## 2018-07-04 DIAGNOSIS — K219 Gastro-esophageal reflux disease without esophagitis: Secondary | ICD-10-CM | POA: Insufficient documentation

## 2018-07-04 DIAGNOSIS — Z79899 Other long term (current) drug therapy: Secondary | ICD-10-CM | POA: Insufficient documentation

## 2018-07-04 DIAGNOSIS — Z7982 Long term (current) use of aspirin: Secondary | ICD-10-CM | POA: Insufficient documentation

## 2018-07-04 DIAGNOSIS — Z9889 Other specified postprocedural states: Secondary | ICD-10-CM | POA: Insufficient documentation

## 2018-07-04 DIAGNOSIS — Z8546 Personal history of malignant neoplasm of prostate: Secondary | ICD-10-CM | POA: Insufficient documentation

## 2018-07-04 DIAGNOSIS — Z96659 Presence of unspecified artificial knee joint: Secondary | ICD-10-CM

## 2018-07-04 DIAGNOSIS — G8918 Other acute postprocedural pain: Secondary | ICD-10-CM | POA: Diagnosis not present

## 2018-07-04 HISTORY — PX: TOTAL KNEE ARTHROPLASTY: SHX125

## 2018-07-04 LAB — GLUCOSE, CAPILLARY
GLUCOSE-CAPILLARY: 293 mg/dL — AB (ref 70–99)
Glucose-Capillary: 198 mg/dL — ABNORMAL HIGH (ref 70–99)
Glucose-Capillary: 167 mg/dL — ABNORMAL HIGH (ref 70–99)

## 2018-07-04 SURGERY — ARTHROPLASTY, KNEE, TOTAL
Anesthesia: Spinal | Site: Knee | Laterality: Left

## 2018-07-04 MED ORDER — SODIUM CHLORIDE 0.9% FLUSH
INTRAVENOUS | Status: DC | PRN
  Administered 2018-07-04: 20 mL

## 2018-07-04 MED ORDER — METHOCARBAMOL 500 MG IVPB - SIMPLE MED
500.0000 mg | Freq: Four times a day (QID) | INTRAVENOUS | Status: DC | PRN
  Filled 2018-07-04: qty 50

## 2018-07-04 MED ORDER — FUROSEMIDE 40 MG PO TABS
40.0000 mg | ORAL_TABLET | Freq: Every day | ORAL | Status: DC
  Administered 2018-07-05: 40 mg via ORAL
  Filled 2018-07-04: qty 1

## 2018-07-04 MED ORDER — CEFAZOLIN SODIUM-DEXTROSE 2-4 GM/100ML-% IV SOLN
2.0000 g | Freq: Four times a day (QID) | INTRAVENOUS | Status: AC
  Administered 2018-07-04 (×2): 2 g via INTRAVENOUS
  Filled 2018-07-04 (×2): qty 100

## 2018-07-04 MED ORDER — METOPROLOL TARTRATE 50 MG PO TABS
100.0000 mg | ORAL_TABLET | Freq: Two times a day (BID) | ORAL | Status: DC
  Administered 2018-07-04 – 2018-07-05 (×2): 100 mg via ORAL
  Filled 2018-07-04 (×2): qty 2

## 2018-07-04 MED ORDER — TRAMADOL HCL 50 MG PO TABS
50.0000 mg | ORAL_TABLET | Freq: Four times a day (QID) | ORAL | Status: DC
  Administered 2018-07-04 – 2018-07-05 (×4): 50 mg via ORAL
  Filled 2018-07-04 (×4): qty 1

## 2018-07-04 MED ORDER — DEXAMETHASONE SODIUM PHOSPHATE 10 MG/ML IJ SOLN
INTRAMUSCULAR | Status: AC
  Filled 2018-07-04: qty 1

## 2018-07-04 MED ORDER — FENTANYL CITRATE (PF) 100 MCG/2ML IJ SOLN
50.0000 ug | Freq: Once | INTRAMUSCULAR | Status: AC | PRN
  Administered 2018-07-04 (×2): 50 ug via INTRAVENOUS

## 2018-07-04 MED ORDER — ACETAMINOPHEN 500 MG PO TABS
1000.0000 mg | ORAL_TABLET | Freq: Once | ORAL | Status: AC
  Administered 2018-07-04: 1000 mg via ORAL

## 2018-07-04 MED ORDER — BUPIVACAINE-EPINEPHRINE (PF) 0.5% -1:200000 IJ SOLN
INTRAMUSCULAR | Status: AC
  Filled 2018-07-04: qty 30

## 2018-07-04 MED ORDER — BUPIVACAINE IN DEXTROSE 0.75-8.25 % IT SOLN
INTRATHECAL | Status: DC | PRN
  Administered 2018-07-04: 2 mL via INTRATHECAL

## 2018-07-04 MED ORDER — ONDANSETRON HCL 4 MG/2ML IJ SOLN: 4.0000 mg | Freq: Once | INTRAMUSCULAR | Status: DC | PRN

## 2018-07-04 MED ORDER — LISINOPRIL 20 MG PO TABS
40.0000 mg | ORAL_TABLET | Freq: Every day | ORAL | Status: DC
  Administered 2018-07-04 – 2018-07-05 (×2): 40 mg via ORAL
  Filled 2018-07-04 (×2): qty 2

## 2018-07-04 MED ORDER — METOCLOPRAMIDE HCL 5 MG/ML IJ SOLN: 5.0000 mg | Freq: Three times a day (TID) | INTRAMUSCULAR | Status: DC | PRN

## 2018-07-04 MED ORDER — MIDAZOLAM HCL 2 MG/2ML IJ SOLN
INTRAMUSCULAR | Status: AC
  Administered 2018-07-04: 1 mg via INTRAVENOUS
  Filled 2018-07-04: qty 2

## 2018-07-04 MED ORDER — DEXAMETHASONE SODIUM PHOSPHATE 10 MG/ML IJ SOLN
10.0000 mg | Freq: Once | INTRAMUSCULAR | Status: AC
  Administered 2018-07-05: 10 mg via INTRAVENOUS
  Filled 2018-07-04: qty 1

## 2018-07-04 MED ORDER — FLEET ENEMA 7-19 GM/118ML RE ENEM: 1.0000 | ENEMA | Freq: Once | RECTAL | Status: DC | PRN

## 2018-07-04 MED ORDER — HYDROMORPHONE HCL 1 MG/ML IJ SOLN: 0.5000 mg | INTRAMUSCULAR | Status: DC | PRN

## 2018-07-04 MED ORDER — FENTANYL CITRATE (PF) 100 MCG/2ML IJ SOLN
INTRAMUSCULAR | Status: AC
  Filled 2018-07-04: qty 2

## 2018-07-04 MED ORDER — ONDANSETRON HCL 4 MG/2ML IJ SOLN: 4.0000 mg | Freq: Four times a day (QID) | INTRAMUSCULAR | Status: DC | PRN

## 2018-07-04 MED ORDER — GABAPENTIN 300 MG PO CAPS
300.0000 mg | ORAL_CAPSULE | Freq: Three times a day (TID) | ORAL | Status: DC
  Administered 2018-07-04 – 2018-07-05 (×4): 300 mg via ORAL
  Filled 2018-07-04 (×4): qty 1

## 2018-07-04 MED ORDER — OXYCODONE HCL 5 MG/5ML PO SOLN
5.0000 mg | Freq: Once | ORAL | Status: DC | PRN
  Filled 2018-07-04: qty 5

## 2018-07-04 MED ORDER — INSULIN ASPART 100 UNIT/ML ~~LOC~~ SOLN
15.0000 [IU] | Freq: Three times a day (TID) | SUBCUTANEOUS | Status: DC
  Administered 2018-07-04: 21 [IU] via SUBCUTANEOUS

## 2018-07-04 MED ORDER — PHENOL 1.4 % MT LIQD: 1.0000 | OROMUCOSAL | Status: DC | PRN

## 2018-07-04 MED ORDER — DEXAMETHASONE SODIUM PHOSPHATE 10 MG/ML IJ SOLN
8.0000 mg | Freq: Once | INTRAMUSCULAR | Status: AC
  Administered 2018-07-04: 8 mg via INTRAVENOUS

## 2018-07-04 MED ORDER — LACTATED RINGERS IV SOLN
INTRAVENOUS | Status: DC
  Administered 2018-07-04: 12:00:00 via INTRAVENOUS
  Administered 2018-07-04: 1000 mL via INTRAVENOUS

## 2018-07-04 MED ORDER — CEFAZOLIN SODIUM-DEXTROSE 2-4 GM/100ML-% IV SOLN
INTRAVENOUS | Status: AC
  Filled 2018-07-04: qty 100

## 2018-07-04 MED ORDER — ONDANSETRON HCL 4 MG PO TABS: 4.0000 mg | ORAL_TABLET | Freq: Four times a day (QID) | ORAL | Status: DC | PRN

## 2018-07-04 MED ORDER — PROPOFOL 500 MG/50ML IV EMUL
INTRAVENOUS | Status: DC | PRN
  Administered 2018-07-04: 100 ug/kg/min via INTRAVENOUS

## 2018-07-04 MED ORDER — CLONIDINE HCL 0.1 MG PO TABS
0.2000 mg | ORAL_TABLET | Freq: Every day | ORAL | Status: DC
  Administered 2018-07-04: 0.2 mg via ORAL
  Filled 2018-07-04: qty 2

## 2018-07-04 MED ORDER — PANTOPRAZOLE SODIUM 40 MG PO TBEC
40.0000 mg | DELAYED_RELEASE_TABLET | Freq: Every day | ORAL | Status: DC
  Administered 2018-07-04 – 2018-07-05 (×2): 40 mg via ORAL
  Filled 2018-07-04 (×2): qty 1

## 2018-07-04 MED ORDER — GABAPENTIN 300 MG PO CAPS
ORAL_CAPSULE | ORAL | Status: AC
  Filled 2018-07-04: qty 1

## 2018-07-04 MED ORDER — BISACODYL 5 MG PO TBEC: 5.0000 mg | DELAYED_RELEASE_TABLET | Freq: Every day | ORAL | Status: DC | PRN

## 2018-07-04 MED ORDER — TRANEXAMIC ACID 1000 MG/10ML IV SOLN
INTRAVENOUS | Status: AC
  Filled 2018-07-04: qty 10

## 2018-07-04 MED ORDER — ACETAMINOPHEN 500 MG PO TABS
ORAL_TABLET | ORAL | Status: AC
  Filled 2018-07-04: qty 2

## 2018-07-04 MED ORDER — ASPIRIN EC 325 MG PO TBEC
325.0000 mg | DELAYED_RELEASE_TABLET | Freq: Two times a day (BID) | ORAL | Status: DC
  Administered 2018-07-05: 325 mg via ORAL
  Filled 2018-07-04: qty 1

## 2018-07-04 MED ORDER — CHLORHEXIDINE GLUCONATE 4 % EX LIQD: 60.0000 mL | Freq: Once | CUTANEOUS | Status: DC

## 2018-07-04 MED ORDER — DOCUSATE SODIUM 100 MG PO CAPS
100.0000 mg | ORAL_CAPSULE | Freq: Two times a day (BID) | ORAL | Status: DC
  Administered 2018-07-04 – 2018-07-05 (×2): 100 mg via ORAL
  Filled 2018-07-04 (×2): qty 1

## 2018-07-04 MED ORDER — METHOCARBAMOL 500 MG PO TABS
500.0000 mg | ORAL_TABLET | Freq: Four times a day (QID) | ORAL | Status: DC | PRN
  Administered 2018-07-04: 500 mg via ORAL
  Filled 2018-07-04: qty 1

## 2018-07-04 MED ORDER — FENTANYL CITRATE (PF) 100 MCG/2ML IJ SOLN: 25.0000 ug | INTRAMUSCULAR | Status: DC | PRN

## 2018-07-04 MED ORDER — GLIMEPIRIDE 2 MG PO TABS
2.0000 mg | ORAL_TABLET | Freq: Two times a day (BID) | ORAL | Status: DC
  Administered 2018-07-04 – 2018-07-05 (×2): 2 mg via ORAL
  Filled 2018-07-04 (×2): qty 1

## 2018-07-04 MED ORDER — FENTANYL CITRATE (PF) 100 MCG/2ML IJ SOLN
INTRAMUSCULAR | Status: AC
  Administered 2018-07-04: 50 ug via INTRAVENOUS
  Filled 2018-07-04: qty 2

## 2018-07-04 MED ORDER — SENNOSIDES-DOCUSATE SODIUM 8.6-50 MG PO TABS: 1.0000 | ORAL_TABLET | Freq: Every evening | ORAL | Status: DC | PRN

## 2018-07-04 MED ORDER — TRANEXAMIC ACID 1000 MG/10ML IV SOLN
1000.0000 mg | INTRAVENOUS | Status: AC
  Administered 2018-07-04: 1000 mg via INTRAVENOUS

## 2018-07-04 MED ORDER — POTASSIUM CHLORIDE CRYS ER 20 MEQ PO TBCR
20.0000 meq | EXTENDED_RELEASE_TABLET | Freq: Two times a day (BID) | ORAL | Status: DC
  Administered 2018-07-04 – 2018-07-05 (×2): 20 meq via ORAL
  Filled 2018-07-04 (×2): qty 1

## 2018-07-04 MED ORDER — SODIUM CHLORIDE 0.9 % IV SOLN
INTRAVENOUS | Status: DC
  Administered 2018-07-04 – 2018-07-05 (×2): via INTRAVENOUS

## 2018-07-04 MED ORDER — MENTHOL 3 MG MT LOZG: 1.0000 | LOZENGE | OROMUCOSAL | Status: DC | PRN

## 2018-07-04 MED ORDER — ALUM & MAG HYDROXIDE-SIMETH 200-200-20 MG/5ML PO SUSP: 30.0000 mL | ORAL | Status: DC | PRN

## 2018-07-04 MED ORDER — METOCLOPRAMIDE HCL 5 MG PO TABS: 5.0000 mg | ORAL_TABLET | Freq: Three times a day (TID) | ORAL | Status: DC | PRN

## 2018-07-04 MED ORDER — OXYCODONE HCL 5 MG PO TABS
5.0000 mg | ORAL_TABLET | ORAL | Status: DC | PRN
  Filled 2018-07-04: qty 2
  Filled 2018-07-04: qty 1

## 2018-07-04 MED ORDER — OXYCODONE HCL 5 MG PO TABS: 5.0000 mg | ORAL_TABLET | Freq: Once | ORAL | Status: DC | PRN

## 2018-07-04 MED ORDER — PROPOFOL 10 MG/ML IV BOLUS
INTRAVENOUS | Status: AC
  Filled 2018-07-04: qty 60

## 2018-07-04 MED ORDER — DIPHENHYDRAMINE HCL 12.5 MG/5ML PO ELIX: 12.5000 mg | ORAL_SOLUTION | ORAL | Status: DC | PRN

## 2018-07-04 MED ORDER — GABAPENTIN 300 MG PO CAPS
300.0000 mg | ORAL_CAPSULE | Freq: Once | ORAL | Status: AC
  Administered 2018-07-04: 300 mg via ORAL

## 2018-07-04 MED ORDER — CEFAZOLIN SODIUM-DEXTROSE 2-4 GM/100ML-% IV SOLN
2.0000 g | INTRAVENOUS | Status: AC
  Administered 2018-07-04: 2 g via INTRAVENOUS

## 2018-07-04 MED ORDER — HYDRALAZINE HCL 50 MG PO TABS
100.0000 mg | ORAL_TABLET | Freq: Three times a day (TID) | ORAL | Status: DC
  Administered 2018-07-04 – 2018-07-05 (×3): 100 mg via ORAL
  Filled 2018-07-04 (×3): qty 2

## 2018-07-04 MED ORDER — SODIUM CHLORIDE 0.9 % IV SOLN
INTRAVENOUS | Status: DC | PRN
  Administered 2018-07-04: 40 ug/min via INTRAVENOUS

## 2018-07-04 MED ORDER — CLONIDINE HCL 0.1 MG PO TABS
0.1000 mg | ORAL_TABLET | Freq: Every day | ORAL | Status: DC
  Administered 2018-07-05: 0.1 mg via ORAL
  Filled 2018-07-04: qty 1

## 2018-07-04 MED ORDER — PHENYLEPHRINE HCL 10 MG/ML IJ SOLN
INTRAMUSCULAR | Status: AC
  Filled 2018-07-04: qty 1

## 2018-07-04 MED ORDER — INSULIN DETEMIR 100 UNIT/ML ~~LOC~~ SOLN
25.0000 [IU] | Freq: Two times a day (BID) | SUBCUTANEOUS | Status: DC
  Administered 2018-07-04 – 2018-07-05 (×2): 25 [IU] via SUBCUTANEOUS
  Filled 2018-07-04 (×3): qty 0.25

## 2018-07-04 MED ORDER — ROSUVASTATIN CALCIUM 10 MG PO TABS
10.0000 mg | ORAL_TABLET | Freq: Every evening | ORAL | Status: DC
  Administered 2018-07-04: 10 mg via ORAL
  Filled 2018-07-04: qty 1

## 2018-07-04 MED ORDER — ZOLPIDEM TARTRATE 5 MG PO TABS: 5.0000 mg | ORAL_TABLET | Freq: Every evening | ORAL | Status: DC | PRN

## 2018-07-04 MED ORDER — TRANEXAMIC ACID 1000 MG/10ML IV SOLN
1000.0000 mg | Freq: Once | INTRAVENOUS | Status: AC
  Administered 2018-07-04: 1000 mg via INTRAVENOUS
  Filled 2018-07-04: qty 1000

## 2018-07-04 MED ORDER — FUROSEMIDE 20 MG PO TABS
20.0000 mg | ORAL_TABLET | Freq: Every day | ORAL | Status: DC
  Administered 2018-07-04: 20 mg via ORAL
  Filled 2018-07-04: qty 1

## 2018-07-04 MED ORDER — BUPIVACAINE LIPOSOME 1.3 % IJ SUSP
INTRAMUSCULAR | Status: DC | PRN
  Administered 2018-07-04: 20 mL

## 2018-07-04 MED ORDER — MIDAZOLAM HCL 2 MG/2ML IJ SOLN
1.0000 mg | Freq: Once | INTRAMUSCULAR | Status: AC | PRN
  Administered 2018-07-04: 1 mg via INTRAVENOUS

## 2018-07-04 MED ORDER — SODIUM CHLORIDE 0.9 % IJ SOLN
INTRAMUSCULAR | Status: AC
  Filled 2018-07-04: qty 20

## 2018-07-04 MED ORDER — ONDANSETRON HCL 4 MG/2ML IJ SOLN
INTRAMUSCULAR | Status: DC | PRN
  Administered 2018-07-04: 4 mg via INTRAVENOUS

## 2018-07-04 MED ORDER — ACETAMINOPHEN 500 MG PO TABS
1000.0000 mg | ORAL_TABLET | Freq: Four times a day (QID) | ORAL | Status: AC
  Administered 2018-07-04 – 2018-07-05 (×3): 1000 mg via ORAL
  Filled 2018-07-04 (×3): qty 2

## 2018-07-04 MED ORDER — LIRAGLUTIDE 18 MG/3ML ~~LOC~~ SOPN: 1.8000 mg | PEN_INJECTOR | Freq: Every day | SUBCUTANEOUS | Status: DC

## 2018-07-04 MED ORDER — PROPOFOL 10 MG/ML IV BOLUS
INTRAVENOUS | Status: AC
  Filled 2018-07-04: qty 20

## 2018-07-04 MED ORDER — SODIUM CHLORIDE 0.9 % IR SOLN
Status: DC | PRN
  Administered 2018-07-04: 1000 mL

## 2018-07-04 MED ORDER — BUPIVACAINE-EPINEPHRINE 0.5% -1:200000 IJ SOLN
INTRAMUSCULAR | Status: DC | PRN
  Administered 2018-07-04: 20 mL

## 2018-07-04 MED ORDER — 0.9 % SODIUM CHLORIDE (POUR BTL) OPTIME
TOPICAL | Status: DC | PRN
  Administered 2018-07-04: 1000 mL

## 2018-07-04 MED ORDER — ONDANSETRON HCL 4 MG/2ML IJ SOLN
INTRAMUSCULAR | Status: AC
  Filled 2018-07-04: qty 2

## 2018-07-04 MED ORDER — FERROUS SULFATE 325 (65 FE) MG PO TABS
325.0000 mg | ORAL_TABLET | Freq: Three times a day (TID) | ORAL | Status: DC
  Administered 2018-07-05 (×2): 325 mg via ORAL
  Filled 2018-07-04 (×2): qty 1

## 2018-07-04 MED ORDER — STERILE WATER FOR IRRIGATION IR SOLN
Status: DC | PRN
  Administered 2018-07-04: 2000 mL

## 2018-07-04 SURGICAL SUPPLY — 56 items
ARTISURF 10M PLYL 10-12GH KNEE (Knees) ×3 IMPLANT
BAG ZIPLOCK 12X15 (MISCELLANEOUS) ×3 IMPLANT
BANDAGE ACE 6X5 VEL STRL LF (GAUZE/BANDAGES/DRESSINGS) ×3 IMPLANT
BLADE SAGITTAL 13X1.27X60 (BLADE) IMPLANT
BLADE SAGITTAL 13X1.27X60MM (BLADE)
BLADE SAW SGTL 13X75X1.27 (BLADE) ×3 IMPLANT
BLADE SAW SGTL 83.5X18.5 (BLADE) ×3 IMPLANT
BLADE SURG 15 STRL LF DISP TIS (BLADE) ×1 IMPLANT
BLADE SURG 15 STRL SS (BLADE) ×2
BOWL SMART MIX CTS (DISPOSABLE) ×3 IMPLANT
CEMENT BONE SIMPLEX SPEEDSET (Cement) ×6 IMPLANT
CLOSURE WOUND 1/2 X4 (GAUZE/BANDAGES/DRESSINGS) ×2
COVER SURGICAL LIGHT HANDLE (MISCELLANEOUS) ×3 IMPLANT
CUFF TOURN SGL QUICK 34 (TOURNIQUET CUFF) ×2
CUFF TRNQT CYL 34X4X40X1 (TOURNIQUET CUFF) ×1 IMPLANT
DECANTER SPIKE VIAL GLASS SM (MISCELLANEOUS) ×6 IMPLANT
DRAPE INCISE IOBAN 66X45 STRL (DRAPES) ×6 IMPLANT
DRAPE U-SHAPE 47X51 STRL (DRAPES) ×3 IMPLANT
DRSG AQUACEL AG ADV 3.5X10 (GAUZE/BANDAGES/DRESSINGS) ×3 IMPLANT
DURAPREP 26ML APPLICATOR (WOUND CARE) ×6 IMPLANT
ELECT REM PT RETURN 15FT ADLT (MISCELLANEOUS) ×3 IMPLANT
FEMUR  CMT CCR STD SZ10 L KNEE (Knees) ×2 IMPLANT
FEMUR CMT CCR STD SZ10 L KNEE (Knees) ×1 IMPLANT
FEMUR CMTD CR PERS STD SZ 5 RT (Knees) ×1 IMPLANT
GLOVE BIOGEL M STRL SZ7.5 (GLOVE) ×6 IMPLANT
GLOVE BIOGEL PI IND STRL 7.5 (GLOVE) IMPLANT
GLOVE BIOGEL PI IND STRL 8.5 (GLOVE) ×2 IMPLANT
GLOVE BIOGEL PI INDICATOR 7.5 (GLOVE)
GLOVE BIOGEL PI INDICATOR 8.5 (GLOVE) ×4
GLOVE SURG ORTHO 8.0 STRL STRW (GLOVE) ×9 IMPLANT
GOWN STRL REUS W/ TWL XL LVL3 (GOWN DISPOSABLE) ×2 IMPLANT
GOWN STRL REUS W/TWL 2XL LVL3 (GOWN DISPOSABLE) ×3 IMPLANT
GOWN STRL REUS W/TWL XL LVL3 (GOWN DISPOSABLE) ×4
HANDPIECE INTERPULSE COAX TIP (DISPOSABLE) ×2
HOOD PEEL AWAY FLYTE STAYCOOL (MISCELLANEOUS) ×9 IMPLANT
MANIFOLD NEPTUNE II (INSTRUMENTS) ×3 IMPLANT
NS IRRIG 1000ML POUR BTL (IV SOLUTION) ×3 IMPLANT
PACK TOTAL KNEE CUSTOM (KITS) ×3 IMPLANT
PADDING CAST COTTON 6X4 STRL (CAST SUPPLIES) ×3 IMPLANT
POSITIONER SURGICAL ARM (MISCELLANEOUS) ×3 IMPLANT
SET HNDPC FAN SPRY TIP SCT (DISPOSABLE) ×1 IMPLANT
STEM POLY PAT PLY 38M KNEE (Knees) ×3 IMPLANT
STEM TIBIA 5 DEG SZ G L KNEE (Knees) ×1 IMPLANT
STRIP CLOSURE SKIN 1/2X4 (GAUZE/BANDAGES/DRESSINGS) ×4 IMPLANT
SUT BONE WAX W31G (SUTURE) ×3 IMPLANT
SUT MNCRL AB 3-0 PS2 18 (SUTURE) ×3 IMPLANT
SUT STRATAFIX 0 PDS 27 VIOLET (SUTURE) ×3
SUT STRATAFIX PDS+ 0 24IN (SUTURE) ×3 IMPLANT
SUT VIC AB 1 CT1 36 (SUTURE) ×3 IMPLANT
SUTURE STRATFX 0 PDS 27 VIOLET (SUTURE) ×1 IMPLANT
SYR CONTROL 10ML LL (SYRINGE) ×6 IMPLANT
TIBIA STEM 5 DEG SZ G L KNEE (Knees) ×3 IMPLANT
TRAY FOLEY MTR SLVR 16FR STAT (SET/KITS/TRAYS/PACK) ×3 IMPLANT
WATER STERILE IRR 1000ML POUR (IV SOLUTION) ×6 IMPLANT
WRAP KNEE MAXI GEL POST OP (GAUZE/BANDAGES/DRESSINGS) ×3 IMPLANT
YANKAUER SUCT BULB TIP 10FT TU (MISCELLANEOUS) ×3 IMPLANT

## 2018-07-04 NOTE — Progress Notes (Signed)
PT Cancellation Note  Patient Details Name: [name] MRN: [id number] DOB: 07-Dec-1950   Cancelled Treatment:    Reason Eval/Treat Not Completed: Medical issues which prohibited therapy. Patient's limbs remain too numb for Out of bed at this time.  [place] Pager [phone].sign  Office [phone]   Claretha Cooper 07/04/2018, 4:33 PM

## 2018-07-04 NOTE — Anesthesia Postprocedure Evaluation (Signed)
Anesthesia Post Note  Patient: Deontrey Massi  Procedure(s) Performed: LEFT TOTAL KNEE ARTHROPLASTY (Left Knee)     Patient location during evaluation: PACU Anesthesia Type: Spinal Level of consciousness: oriented and awake and alert Pain management: pain level controlled Vital Signs Assessment: post-procedure vital signs reviewed and stable Respiratory status: spontaneous breathing, respiratory function stable and patient connected to nasal cannula oxygen Cardiovascular status: blood pressure returned to baseline and stable Postop Assessment: no headache, no backache and no apparent nausea or vomiting Anesthetic complications: no    Last Vitals:  Vitals:   07/04/18 1315 07/04/18 1336  BP: 134/83 (!) 142/83  Pulse: 64 64  Resp: 12 15  Temp: (!) 36.4 C 36.4 C  SpO2: 95% 97%    Last Pain:  Vitals:   07/04/18 1336  TempSrc: Oral  PainSc: 0-No pain                 Allin Frix COKER

## 2018-07-04 NOTE — Anesthesia Preprocedure Evaluation (Signed)
Anesthesia Evaluation  Patient identified by MRN, date of birth, ID band Patient awake    Reviewed: Allergy & Precautions, NPO status , Patient's Chart, lab work & pertinent test results  Airway Mallampati: II  TM Distance: >3 FB Neck ROM: Full    Dental  (+) Teeth Intact, Dental Advisory Given   Pulmonary former smoker,    breath sounds clear to auscultation       Cardiovascular hypertension,  Rhythm:Regular Rate:Normal     Neuro/Psych    GI/Hepatic   Endo/Other  diabetes  Renal/GU      Musculoskeletal   Abdominal   Peds  Hematology   Anesthesia Other Findings   Reproductive/Obstetrics                             Anesthesia Physical Anesthesia Plan  ASA: III  Anesthesia Plan: Spinal   Post-op Pain Management:    Induction: Intravenous  PONV Risk Score and Plan: Ondansetron and Dexamethasone  Airway Management Planned:   Additional Equipment:   Intra-op Plan:   Post-operative Plan:   Informed Consent: I have reviewed the patients History and Physical, chart, labs and discussed the procedure including the risks, benefits and alternatives for the proposed anesthesia with the patient or authorized representative who has indicated his/her understanding and acceptance.   Dental advisory given  Plan Discussed with: Anesthesiologist and CRNA  Anesthesia Plan Comments:         Anesthesia Quick Evaluation

## 2018-07-04 NOTE — Transfer of Care (Signed)
Immediate Anesthesia Transfer of Care Note  Patient: Timothy Wolfe  Procedure(s) Performed: LEFT TOTAL KNEE ARTHROPLASTY (Left Knee)  Patient Location: PACU  Anesthesia Type:Regional and Spinal  Level of Consciousness: sedated  Airway & Oxygen Therapy: Patient Spontanous Breathing and Patient connected to face mask oxygen  Post-op Assessment: Report given to RN and Post -op Vital signs reviewed and stable  Post vital signs: Reviewed and stable  Last Vitals:  Vitals Value Taken Time  BP    Temp    Pulse    Resp    SpO2      Last Pain:  Vitals:   07/04/18 1028  TempSrc:   PainSc: 0-No pain      Patients Stated Pain Goal: 4 (25/36/64 4034)  Complications: No apparent anesthesia complications

## 2018-07-04 NOTE — Progress Notes (Signed)
AssistedDr. Joslin with left, ultrasound guided, adductor canal block. Side rails up, monitors on throughout procedure. See vital signs in flow sheet. Tolerated Procedure well.  

## 2018-07-04 NOTE — Anesthesia Procedure Notes (Signed)
Spinal  Patient location during procedure: OR Start time: 07/04/2018 10:39 AM End time: 07/04/2018 10:44 AM Reason for block: at surgeon's request Staffing Resident/CRNA: Anne Fu, CRNA Performed: resident/CRNA  Preanesthetic Checklist Completed: patient identified, site marked, surgical consent, pre-op evaluation, timeout performed, IV checked, risks and benefits discussed and monitors and equipment checked Spinal Block Patient position: sitting Prep: DuraPrep Patient monitoring: heart rate, continuous pulse ox and blood pressure Approach: right paramedian Location: L2-3 Injection technique: single-shot Needle Needle type: Pencan  Needle gauge: 24 G Needle length: 9 cm Assessment Sensory level: T6 Additional Notes  Functioning IV was confirmed and monitors were applied. Expiration date of kit checked and confirmed. Sterile prep and drape, including hand hygiene and sterile gloves were used. The patient was positioned and the spine was prepped. The skin was anesthetized with lidocaine.  Free flow of clear CSF was obtained prior to injecting local anesthetic into the CSF X 1 attempt.  The spinal needle aspirated freely following injection.  The needle was carefully withdrawn. Patient tolerated procedure well, without complications. Loss of motor and sensory on exam post injection.

## 2018-07-04 NOTE — H&P (Signed)
Boston Cookson MRN:  [id number] DOB/SEX:  1950-10-14/male  CHIEF COMPLAINT:  Painful left Knee  HISTORY: Patient is a 67 y.o. male presented with a history of pain in the left knee. Onset of symptoms was gradual starting a few years ago with gradually worsening course since that time. Patient has been treated conservatively with over-the-counter NSAIDs and activity modification. Patient currently rates pain in the knee at 10 out of 10 with activity. There is pain at night.  PAST MEDICAL HISTORY: Patient Active Problem List   Diagnosis Date Noted  . Diabetes mellitus ([place]) 04/21/2012  . Hypertension 04/21/2012  . Hypercholesterolemia 04/21/2012  . 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 Acid reflux   . Arthritis   . Cancer [facility])    prostate -seed radiation  . Diabetes mellitus type 2, insulin dependent ([place])   . Hypertension    Past Surgical History:  Procedure Laterality Date  . BIOPSY PROSTATE  03/15/2012   3+3=6  . INSERTION PROSTATE RADIATION SEED  2014  . RADIOACTIVE SEED IMPLANT  06/03/2012   Procedure: RADIOACTIVE SEED IMPLANT;  Surgeon: Claybon Jabs, MD;  Location: [facility];  Service: Urology;  Laterality: N/A;  rad tech ok per vickie at main DR PORTABLE NEEDED     MEDICATIONS:   No medications prior to admission.    ALLERGIES:  No Known Allergies  REVIEW OF SYSTEMS:  A comprehensive review of systems was negative except for: Musculoskeletal: positive for arthralgias and bone pain   FAMILY HISTORY:  No family history on file.  SOCIAL HISTORY:   Social History   Tobacco Use  . Smoking status: Former Smoker    Packs/day: 1.00    Years: 12.00    Pack years: 12.00    Types: Cigarettes    Last attempt to quit: 04/20/1980    Years since quitting: 38.2  . Smokeless tobacco: Never Used  Substance Use Topics  . Alcohol use: No      EXAMINATION:  Vital signs in last 24 hours:    There were no vitals taken for this visit.  General Appearance:    Alert, cooperative, no distress, appears stated age  Head:    Normocephalic, without obvious abnormality, atraumatic  Eyes:    PERRL, conjunctiva/corneas clear, EOM's intact, fundi    benign, both eyes       Ears:    Normal TM's and external ear canals, both ears  Nose:   Nares normal, septum midline, mucosa normal, no drainage    or sinus tenderness  Throat:   Lips, mucosa, and tongue normal; teeth and gums normal  Neck:   Supple, symmetrical, trachea midline, no adenopathy;       thyroid:  No enlargement/tenderness/nodules; no carotid   bruit or JVD  Back:     Symmetric, no curvature, ROM normal, no CVA tenderness  Lungs:     Clear to auscultation bilaterally, respirations unlabored  Chest wall:    No tenderness or deformity  Heart:    Regular rate and rhythm, S1 and S2 normal, no murmur, rub   or gallop  Abdomen:     Soft, non-tender, bowel sounds active all four quadrants,    no masses, no organomegaly  Genitalia:    Normal male without lesion, discharge or tenderness  Rectal:    Normal tone, normal prostate, no masses or tenderness;   guaiac negative stool  Extremities:   Extremities normal, atraumatic, no cyanosis or edema  Pulses:   2+ and symmetric all extremities  Skin:   Skin color, texture, turgor normal, no rashes or lesions  Lymph nodes:   Cervical, supraclavicular, and axillary nodes normal  Neurologic:   CNII-XII intact. Normal strength, sensation and reflexes      throughout    Musculoskeletal:  ROM 0-120, Ligaments intact,  Imaging Review Plain radiographs demonstrate severe degenerative joint disease of the left knee. The overall alignment is neutral. The bone quality appears to be good for age and reported activity level.  Assessment/Plan: Primary osteoarthritis, left knee   The patient history, physical examination and imaging studies are  consistent with advanced degenerative joint disease of the left knee. The patient has failed conservative treatment.  The clearance notes were reviewed.  After discussion with the patient it was felt that Total Knee Replacement was indicated. The procedure,  risks, and benefits of total knee arthroplasty were presented and reviewed. The risks including but not limited to aseptic loosening, infection, blood clots, vascular injury, stiffness, patella tracking problems complications among others were discussed. The patient acknowledged the explanation, agreed to proceed with the plan.  Preoperative templating of the joint replacement has been completed, documented, and submitted to the Operating Room personnel in order to optimize intra-operative equipment management.    Patient's anticipated LOS is less than 2 midnights, meeting these requirements: - Lives within 1 hour of care - Has a competent adult at home to recover with post-op recover - NO history of  - Chronic pain requiring opiods  - Diabetes  - Coronary Artery Disease  - Heart failure  - Heart attack  - Stroke  - DVT/VTE  - Cardiac arrhythmia  - Respiratory Failure/COPD  - Renal failure  - Anemia  - Advanced Liver disease       Donia Ast 07/04/2018, 6:08 AM

## 2018-07-04 NOTE — Evaluation (Signed)
Physical Therapy Evaluation Patient Details Name: Timothy Wolfe MRN: [id number] DOB: 06-30-1951 Today's Date: 07/04/2018   History of Present Illness  67 YO male s/p L TKR on 07/04/18. PMH includes DMII, HTN, prostate cancer.   Clinical Impression  Pt presents with moderate L knee pain, difficulty performing mobility tasks, and decreased tolerance for activity. Pt limited mostly this session by partial numbness of gluteal region, tolerated stand pivot to recliner at bedside. PT to progress mobility, namely ambulation, tomorrow. PT to continue to follow acutely, will progress mobility as tolerated.     Follow Up Recommendations Follow surgeon's recommendation for DC plan and follow-up therapies;Supervision for mobility/OOB(HHPT)    Equipment Recommendations  None recommended by PT    Recommendations for Other Services       Precautions / Restrictions Precautions Precautions: Fall Restrictions Weight Bearing Restrictions: No Other Position/Activity Restrictions: WBAT       Mobility  Bed Mobility Overal bed mobility: Needs Assistance Bed Mobility: Supine to Sit     Supine to sit: Min guard;HOB elevated     General bed mobility comments: Min guard for safety. Verbal cuing for sequencing. Increased time/effot to sit EOB.   Transfers Overall transfer level: Needs assistance Equipment used: Rolling walker (2 wheeled) Transfers: Sit to/from [facility] Sit to Stand: Min guard;From elevated surface;+2 safety/equipment Stand pivot transfers: Min guard       General transfer comment: Pt min guard for safety. Verbal cuing for hand placement. Pt reporting gluteal region still felt numb, pt was able to take a couple steps forward and transfer to recliner. Pt with increased time to perform hip extension upon standing. Ambulation deferred at this time due to partial numbness of gluteal region.   Ambulation/Gait Ambulation/Gait assistance: (Deferred, gluteal parital  numbness)              Stairs            Wheelchair Mobility    Modified Rankin (Stroke Patients Only)       Balance Overall balance assessment: Mild deficits observed, not formally tested                                           Pertinent Vitals/Pain Pain Assessment: 0-10 Pain Score: 3  Pain Location: L knee  Pain Descriptors / Indicators: Constant;Sore Pain Intervention(s): Limited activity within patient's tolerance;Monitored during session;Repositioned;Ice applied    Home Living Family/patient expects to be discharged to:: Private residence Living Arrangements: Spouse/significant other Available Help at Discharge: Available 24 hours/day;Family Type of Home: House Home Access: Stairs to enter Entrance Stairs-Rails: None Entrance Stairs-Number of Steps: 1 Home Layout: One level Home Equipment: Environmental consultant - 2 wheels;Cane - single point;Bedside commode      Prior Function Level of Independence: Independent with assistive device(s)         Comments: Pt used cane as needed when knee was bothering him, or when he needed to walk a lot      Hand Dominance   Dominant Hand: Right    Extremity/Trunk Assessment   Upper Extremity Assessment Upper Extremity Assessment: Overall WFL for tasks assessed    Lower Extremity Assessment Lower Extremity Assessment: Overall WFL for tasks assessed;LLE deficits/detail LLE Deficits / Details: Suspected post-surgical weakness; able to perform quad set x2, ankle pumps, SLR with assist, heel slide  LLE Sensation: decreased light touch(Pt reporting potential numbness of gluteal region. Pt  willing to mobilize, had been in the same position for multiple hours and potentially not numb from anesthesia, but from positioning. )    Cervical / Trunk Assessment Cervical / Trunk Assessment: Normal  Communication   Communication: No difficulties  Cognition Arousal/Alertness: Awake/alert Behavior During Therapy: WFL  for tasks assessed/performed Overall Cognitive Status: Within Functional Limits for tasks assessed                                        General Comments      Exercises Total Joint Exercises Ankle Circles/Pumps: AROM;Both;10 reps;Seated Knee Flexion: AROM;Left;Supine Goniometric ROM: Knee AROM 5-95*, limited by pain    Assessment/Plan    PT Assessment Patient needs continued PT services  PT Problem List Decreased strength;Pain;Decreased range of motion;Decreased activity tolerance;Decreased knowledge of use of DME;Decreased balance;Decreased safety awareness;Decreased mobility       PT Treatment Interventions DME instruction;Therapeutic activities;Gait training;Therapeutic exercise;Patient/family education;Stair training;Balance training;Functional mobility training    PT Goals (Current goals can be found in the Care Plan section)  Acute Rehab PT Goals PT Goal Formulation: With patient Time For Goal Achievement: 07/11/18 Potential to Achieve Goals: Good    Frequency 7X/week   Barriers to discharge        Co-evaluation               AM-PAC PT "6 Clicks" Daily Activity  Outcome Measure Difficulty turning over in bed (including adjusting bedclothes, sheets and blankets)?: Unable Difficulty moving from lying on back to sitting on the side of the bed? : Unable Difficulty sitting down on and standing up from a chair with arms (e.g., wheelchair, bedside commode, etc,.)?: Unable Help needed moving to and from a bed to chair (including a wheelchair)?: A Little Help needed walking in hospital room?: A Little Help needed climbing 3-5 steps with a railing? : A Little 6 Click Score: 12    End of Session Equipment Utilized During Treatment: Gait belt Activity Tolerance: Patient tolerated treatment well;Treatment limited secondary to medical complications (Comment)(partial numbness of gluteal region ) Patient left: in chair;with chair alarm set;with call  bell/phone within reach;with family/visitor present(instructed in ankle pumps for circulation; bone foam donned) Nurse Communication: Mobility status PT Visit Diagnosis: Other abnormalities of gait and mobility (R26.89);Difficulty in walking, not elsewhere classified (R26.2)    Time: 6767-2094 PT Time Calculation (min) (ACUTE ONLY): 24 min   Charges:   PT Evaluation $PT Eval Low Complexity: 1 Low PT Treatments $Therapeutic Activity: 8-22 mins      Julien Girt, PT Acute Rehabilitation Services Pager [phone]  Office [phone]   Kristyl Athens D Melven Stockard 07/04/2018, 7:13 PM

## 2018-07-04 NOTE — Anesthesia Procedure Notes (Signed)
Anesthesia Regional Block: Adductor canal block   Pre-Anesthetic Checklist: ,, timeout performed, Correct Patient, Correct Site, Correct Laterality, Correct Procedure, Correct Position, site marked, Risks and benefits discussed, pre-op evaluation,  At surgeon's request and post-op pain management  Laterality: Left  Prep: Maximum Sterile Barrier Precautions used, chloraprep       Needles:  Injection technique: Single-shot  Needle Type: Echogenic Stimulator Needle     Needle Length: 9cm  Needle Gauge: 21     Additional Needles:   Procedures:,,,, ultrasound used (permanent image in chart),,,,  Narrative:  Start time: 07/04/2018 10:20 AM End time: 07/04/2018 10:30 AM Injection made incrementally with aspirations every 5 mL.  Performed by: Personally  Anesthesiologist: Roberts Gaudy, MD  Additional Notes: 20 cc 0.75% Ropivacaine with 0.2 mg clonidine injected easily

## 2018-07-05 ENCOUNTER — Encounter (HOSPITAL_COMMUNITY): Payer: Self-pay | Admitting: Orthopedic Surgery

## 2018-07-05 DIAGNOSIS — Z794 Long term (current) use of insulin: Secondary | ICD-10-CM | POA: Diagnosis not present

## 2018-07-05 DIAGNOSIS — K219 Gastro-esophageal reflux disease without esophagitis: Secondary | ICD-10-CM | POA: Diagnosis not present

## 2018-07-05 DIAGNOSIS — E119 Type 2 diabetes mellitus without complications: Secondary | ICD-10-CM | POA: Diagnosis not present

## 2018-07-05 DIAGNOSIS — Z7982 Long term (current) use of aspirin: Secondary | ICD-10-CM | POA: Diagnosis not present

## 2018-07-05 DIAGNOSIS — M1712 Unilateral primary osteoarthritis, left knee: Secondary | ICD-10-CM | POA: Diagnosis not present

## 2018-07-05 DIAGNOSIS — I10 Essential (primary) hypertension: Secondary | ICD-10-CM | POA: Diagnosis not present

## 2018-07-05 LAB — BASIC METABOLIC PANEL
Anion gap: 10 (ref 5–15)
BUN: 25 mg/dL — AB (ref 8–23)
CALCIUM: 8.5 mg/dL — AB (ref 8.9–10.3)
CHLORIDE: 106 mmol/L (ref 98–111)
CO2: 24 mmol/L (ref 22–32)
CREATININE: 1.4 mg/dL — AB (ref 0.61–1.24)
GFR calc Af Amer: 59 mL/min — ABNORMAL LOW (ref >60)
GFR calc non Af Amer: 50 mL/min — ABNORMAL LOW (ref >60)
GLUCOSE: 208 mg/dL — AB (ref 70–99)
Potassium: 3.4 mmol/L — ABNORMAL LOW (ref 3.5–5.1)
Sodium: 140 mmol/L (ref 135–145)

## 2018-07-05 LAB — GLUCOSE, CAPILLARY
GLUCOSE-CAPILLARY: 160 mg/dL — AB (ref 70–99)
GLUCOSE-CAPILLARY: 172 mg/dL — AB (ref 70–99)

## 2018-07-05 LAB — CBC
HEMATOCRIT: 38 % — AB (ref 39.0–52.0)
HEMOGLOBIN: 12.9 g/dL — AB (ref 13.0–17.0)
MCH: 30.6 pg (ref 26.0–34.0)
MCHC: 33.9 g/dL (ref 30.0–36.0)
MCV: 90.3 fL (ref 80.0–100.0)
Platelets: 185 10*3/uL (ref 150–400)
RBC: 4.21 MIL/uL — ABNORMAL LOW (ref 4.22–5.81)
RDW: 12.6 % (ref 11.5–15.5)
WBC: 15.1 10*3/uL — ABNORMAL HIGH (ref 4.0–10.5)

## 2018-07-05 MED ORDER — INSULIN ASPART 100 UNIT/ML ~~LOC~~ SOLN
15.0000 [IU] | Freq: Every day | SUBCUTANEOUS | Status: DC
  Administered 2018-07-05: 15 [IU] via SUBCUTANEOUS

## 2018-07-05 MED ORDER — INSULIN ASPART 100 UNIT/ML ~~LOC~~ SOLN
18.0000 [IU] | Freq: Every day | SUBCUTANEOUS | Status: DC
  Administered 2018-07-05: 18 [IU] via SUBCUTANEOUS

## 2018-07-05 MED ORDER — OXYCODONE HCL 5 MG PO TABS: 5.0000 mg | ORAL_TABLET | Freq: Four times a day (QID) | ORAL | 0 refills | Status: AC | PRN

## 2018-07-05 MED ORDER — ACETAMINOPHEN 500 MG PO TABS: 1000.0000 mg | ORAL_TABLET | Freq: Four times a day (QID) | ORAL | 0 refills | Status: AC

## 2018-07-05 MED ORDER — INSULIN ASPART 100 UNIT/ML ~~LOC~~ SOLN: 24.0000 [IU] | Freq: Every day | SUBCUTANEOUS | Status: DC

## 2018-07-05 MED ORDER — ASPIRIN 325 MG PO TBEC: 325.0000 mg | DELAYED_RELEASE_TABLET | Freq: Two times a day (BID) | ORAL | 0 refills | Status: AC

## 2018-07-05 MED ORDER — METHOCARBAMOL 500 MG PO TABS: 500.0000 mg | ORAL_TABLET | Freq: Four times a day (QID) | ORAL | 0 refills | Status: AC | PRN

## 2018-07-05 NOTE — Plan of Care (Signed)
Patient to d/c home after 2nd therapy session today. I assisted him to the bathroom and his gait is steady, no c/o pain

## 2018-07-05 NOTE — Care Management Obs Status (Signed)
MEDICARE OBSERVATION STATUS NOTIFICATION   Patient Details  Name: Timothy Wolfe MRN: [id number] Date of Birth: 27-Feb-1951   Medicare Observation Status Notification Given:  Yes    Guadalupe Maple, RN 07/05/2018, 9:38 AM

## 2018-07-05 NOTE — Progress Notes (Signed)
Physical Therapy Treatment Patient Details Name: Timothy Wolfe MRN: [id number] DOB: 11-19-1950 Today's Date: 07/05/2018    History of Present Illness 67 YO male s/p L TKR on 07/04/18. PMH includes DMII, HTN, prostate cancer.     PT Comments    POD # 1 am session Assisted OOB to amb an increased distance then returned to room to perform some TKR TE's following HEP handout.  Instructed on proper tech, freq as well as use of ICE.  Pt progressing well and hopes to D/C to home later today.   Follow Up Recommendations  Follow surgeon's recommendation for DC plan and follow-up therapies;Supervision for mobility/OOB     Equipment Recommendations  None recommended by PT    Recommendations for Other Services       Precautions / Restrictions Precautions Precautions: Fall Restrictions Weight Bearing Restrictions: No    Mobility  Bed Mobility Overal bed mobility: Needs Assistance Bed Mobility: Supine to Sit     Supine to sit: Supervision;Min guard     General bed mobility comments: Min guard for safety. Verbal cuing for sequencing. Increased time/effot to sit EOB.   Transfers Overall transfer level: Needs assistance Equipment used: Rolling walker (2 wheeled) Transfers: Sit to/from [facility] Sit to Stand: Min guard;From elevated surface;+2 safety/equipment Stand pivot transfers: Min guard;Supervision       General transfer comment: <25% VC's on safetywith turns and L LE advancement prior to sit/stand  Ambulation/Gait Ambulation/Gait assistance: Supervision;Min guard Gait Distance (Feet): 85 Feet Assistive device: Rolling walker (2 wheeled) Gait Pattern/deviations: Step-to pattern;Decreased stance time - left;Trunk flexed Gait velocity: decreased   General Gait Details: 25% VC's on proper walker to self distance and upright posture.     Stairs             Wheelchair Mobility    Modified Rankin (Stroke Patients Only)       Balance                                             Cognition Arousal/Alertness: Awake/alert Behavior During Therapy: WFL for tasks assessed/performed Overall Cognitive Status: Within Functional Limits for tasks assessed                                        Exercises   Total Knee Replacement TE's 10 reps B LE ankle pumps 10 reps towel squeezes 10 reps knee presses 10 reps heel slides  10 reps SAQ's 10 reps SLR's 10 reps ABD Followed by ICE     General Comments        Pertinent Vitals/Pain Pain Assessment: No/denies pain Pain Score: 4  Pain Location: L knee  Pain Descriptors / Indicators: Operative site guarding;Tender Pain Intervention(s): Monitored during session;Repositioned;Ice applied    Home Living                      Prior Function            PT Goals (current goals can now be found in the care plan section) Progress towards PT goals: Progressing toward goals    Frequency    7X/week      PT Plan Current plan remains appropriate    Co-evaluation  AM-PAC PT "6 Clicks" Daily Activity  Outcome Measure  Difficulty turning over in bed (including adjusting bedclothes, sheets and blankets)?: A Lot Difficulty moving from lying on back to sitting on the side of the bed? : A Lot Difficulty sitting down on and standing up from a chair with arms (e.g., wheelchair, bedside commode, etc,.)?: A Lot Help needed moving to and from a bed to chair (including a wheelchair)?: A Lot Help needed walking in hospital room?: A Lot Help needed climbing 3-5 steps with a railing? : Total 6 Click Score: 11    End of Session Equipment Utilized During Treatment: Gait belt Activity Tolerance: Patient tolerated treatment well;Treatment limited secondary to medical complications (Comment) Patient left: in chair;with chair alarm set;with call bell/phone within reach;with family/visitor present Nurse Communication: Mobility  status PT Visit Diagnosis: Other abnormalities of gait and mobility (R26.89);Difficulty in walking, not elsewhere classified (R26.2)     Time: 2778-2423 PT Time Calculation (min) (ACUTE ONLY): 32 min  Charges:  $Gait Training: 8-22 mins $Therapeutic Exercise: 8-22 mins                     Rica Koyanagi  PTA Acute  Rehabilitation Services Pager      [phone] Office      [phone]

## 2018-07-05 NOTE — Progress Notes (Signed)
SPORTS MEDICINE AND JOINT REPLACEMENT  Lara Mulch, MD    Carlyon Shadow, PA-C [address], [place], [place]  [address]                             [phone]   PROGRESS NOTE  Subjective:  negative for Chest Pain  negative for Shortness of Breath  negative for Nausea/Vomiting   negative for Calf Pain  negative for Bowel Movement   Tolerating Diet: yes         Patient reports pain as 3 on 0-10 scale.    Objective: Vital signs in last 24 hours:    Patient Vitals for the past 24 hrs:  BP Temp Temp src Pulse Resp SpO2 Height Weight  07/05/18 0535 124/67 98 F (36.7 C) Oral 66 - 97 % - -  07/05/18 0114 109/67 97.6 F (36.4 C) Oral 69 17 97 % - -  07/04/18 2115 126/73 97.9 F (36.6 C) Oral 79 16 98 % - -  07/04/18 1641 129/66 97.7 F (36.5 C) Oral 74 14 96 % - -  07/04/18 1548 137/79 (!) 97.4 F (36.3 C) Oral 72 14 97 % - -  07/04/18 1427 (!) 146/92 97.6 F (36.4 C) Oral 64 16 97 % - -  07/04/18 1336 (!) 142/83 97.6 F (36.4 C) Oral 64 15 97 % - -  07/04/18 1315 134/83 (!) 97.5 F (36.4 C) - 64 12 95 % - -  07/04/18 1300 134/79 - - 72 15 96 % - -  07/04/18 1245 123/82 - - 75 12 100 % - -  07/04/18 1230 121/77 - - 75 16 100 % - -  07/04/18 1215 115/71 (!) 97.5 F (36.4 C) - 82 17 97 % - -  07/04/18 1029 (!) 157/79 - - - 14 - - -  07/04/18 1028 - - - 70 14 96 % - -  07/04/18 1027 - - - 66 14 95 % - -  07/04/18 1026 - - - 69 16 96 % - -  07/04/18 1025 - - - 67 11 96 % - -  07/04/18 1024 (!) 162/78 - - 76 (!) 21 97 % - -  07/04/18 1023 - - - 69 14 97 % - -  07/04/18 1022 - - - 69 14 97 % - -  07/04/18 1021 - - - 69 17 97 % - -  07/04/18 1020 - - - 72 19 98 % - -  07/04/18 1019 (!) 161/80 - - 68 16 98 % - -  07/04/18 1018 - - - 73 (!) 21 96 % - -  07/04/18 1017 - - - 68 17 97 % - -  07/04/18 1016 - - - 69 13 98 % - -  07/04/18 1015 - - - 71 18 98 % - -  07/04/18 1014 (!) 152/82 - - 70 16 97 % - -  07/04/18 0914 - - - - - - 6\' 2"  (1.88 m) 110.7 kg   07/04/18 0909 137/76 98.4 F (36.9 C) Oral 80 18 96 % - -    @flow {1959:LAST@   Intake/Output from previous day:   10/07 0701 - 10/08 0700 In: 2518.8 [P.O.:240; I.V.:2178.8] Out: 1510 [Urine:1500]   Intake/Output this shift:   No intake/output data recorded.   Intake/Output      10/07 0701 - 10/08 0700 10/08 0701 - 10/09 0700   P.O. 240  I.V. (mL/kg) 2178.8 (19.7)    IV Piggyback 100    Total Intake(mL/kg) 2518.8 (22.8)    Urine (mL/kg/hr) 1500    Stool 0    Blood 10    Total Output 1510    Net +1008.8            LABORATORY DATA: Recent Labs    07/05/18 0508  WBC 15.1*  HGB 12.9*  HCT 38.0*  PLT 185   Recent Labs    07/05/18 0508  NA 140  K 3.4*  CL 106  CO2 24  BUN 25*  CREATININE 1.40*  GLUCOSE 208*  CALCIUM 8.5*   Lab Results  Component Value Date   INR 1.06 05/27/2012    Examination:  General appearance: alert, cooperative and no distress Extremities: extremities normal, atraumatic, no cyanosis or edema  Wound Exam: clean, dry, intact   Drainage:  None: wound tissue dry  Motor Exam: Quadriceps and Hamstrings Intact  Sensory Exam: Superficial Peroneal, Deep Peroneal and Tibial normal   Assessment:    1 Day Post-Op  Procedure(s) (LRB): LEFT TOTAL KNEE ARTHROPLASTY (Left)  ADDITIONAL DIAGNOSIS:  Active Problems:   S/P total knee replacement     Plan: Physical Therapy as ordered Weight Bearing as Tolerated (WBAT)  DVT Prophylaxis:  Aspirin  DISCHARGE PLAN: Home  DISCHARGE NEEDS: HHPT     Patient doing well, expect D/C home tomorrow  Patient's anticipated LOS is less than 2 midnights, meeting these requirements: - Lives within 1 hour of care - Has a competent adult at home to recover with post-op recover - NO history of  - Chronic pain requiring opiods  - Diabetes  - Coronary Artery Disease  - Heart failure  - Heart attack  - Stroke  - DVT/VTE  - Cardiac arrhythmia  - Respiratory Failure/COPD  - Renal failure  -  Anemia  - Advanced Liver disease        Donia Ast 07/05/2018, 7:03 AM

## 2018-07-05 NOTE — Care Management Note (Signed)
Case Management Note  Patient Details  Name: Timothy Wolfe MRN: [id number] Date of Birth: 31-Jul-1951  Subjective/Objective:   Discharge planning, spoke with patient at bedside. Have chosen [facility] PT, evaluate and treat.  Action/Plan: [facility] for referral. Has DME. [phone]                 Expected Discharge Date:                  Expected Discharge Plan:  [place]  In-House Referral:  NA  Discharge planning Services  CM Consult  Post Acute Care Choice:  Home Health Choice offered to:  Patient  DME Arranged:  N/A DME Agency:  NA  HH Arranged:  PT [place] Agency:  [facility] (formerly [employer])  Status of Service:  Completed, signed off  If discussed at [employer], dates discussed:    Additional Comments:  Guadalupe Maple, RN 07/05/2018, 9:40 AM

## 2018-07-05 NOTE — Op Note (Signed)
TOTAL KNEE REPLACEMENT OPERATIVE NOTE:  07/04/2018  8:45 PM  PATIENT:  Timothy Wolfe  67 y.o. male  PRE-OPERATIVE DIAGNOSIS:  primary osteoarthritis left knee  POST-OPERATIVE DIAGNOSIS:  primary osteoarthritis left knee  PROCEDURE:  Procedure(s): LEFT TOTAL KNEE ARTHROPLASTY  SURGEON:  Surgeon(s): Vickey Huger, MD  PHYSICIAN ASSISTANT: Carlyon Shadow, PA-C   ANESTHESIA:   spinal  SPECIMEN: None  COUNTS:  Correct  TOURNIQUET:   Total Tourniquet Time Documented: Thigh (Left) - 43 minutes Total: Thigh (Left) - 43 minutes   DICTATION:  Indication for procedure:    The patient is a 67 y.o. male who has failed conservative treatment for primary osteoarthritis left knee.  Informed consent was obtained prior to anesthesia. The risks versus benefits of the operation were explain and in a way the patient can, and did, understand.   On the implant demand matching protocol, this patient scored 10.  Therefore, this patient was not receive a polyethylene insert with vitamin E which is a high demand implant.  Description of procedure:     The patient was taken to the operating room and placed under anesthesia.  The patient was positioned in the usual fashion taking care that all body parts were adequately padded and/or protected.  A tourniquet was applied and the leg prepped and draped in the usual sterile fashion.  The extremity was exsanguinated with the esmarch and tourniquet inflated to 350 mmHg.  Pre-operative range of motion was normal.  The knee was in 5 degree of mild varus.  A midline incision approximately 6-7 inches long was made with a #10 blade.  A new blade was used to make a parapatellar arthrotomy going 2-3 cm into the quadriceps tendon, over the patella, and alongside the medial aspect of the patellar tendon.  A synovectomy was then performed with the #10 blade and forceps. I then elevated the deep MCL off the medial tibial metaphysis subperiosteally around to the  semimembranosus attachment.    I everted the patella and used calipers to measure patellar thickness.  I used the reamer to ream down to appropriate thickness to recreate the native thickness.  I then removed excess bone with the rongeur and sagittal saw.  I used the appropriately sized template and drilled the three lug holes.  I then put the trial in place and measured the thickness with the calipers to ensure recreation of the native thickness.  The trial was then removed and the patella subluxed and the knee brought into flexion.  A homan retractor was place to retract and protect the patella and lateral structures.  A Z-retractor was place medially to protect the medial structures.  The extra-medullary alignment system was used to make cut the tibial articular surface perpendicular to the anamotic axis of the tibia and in 3 degrees of posterior slope.  The cut surface and alignment jig was removed.  I then used the intramedullary alignment guide to make a 6 valgus cut on the distal femur.  I then marked out the epicondylar axis on the distal femur.  The posterior condylar axis measured 3 degrees.  I then used the anterior referencing sizer and measured the femur to be a size 10.  The 4-In-1 cutting block was screwed into place in external rotation matching the posterior condylar angle, making our cuts perpendicular to the epicondylar axis.  Anterior, posterior and chamfer cuts were made with the sagittal saw.  The cutting block and cut pieces were removed.  A lamina spreader was placed in 90  degrees of flexion.  The ACL, PCL, menisci, and posterior condylar osteophytes were removed.  A 10 mm spacer blocked was found to offer good flexion and extension gap balance after moderate in degree releasing.   The scoop retractor was then placed and the femoral finishing block was pinned in place.  The small sagittal saw was used as well as the lug drill to finish the femur.  The block and cut surfaces were  removed and the medullary canal hole filled with autograft bone from the cut pieces.  The tibia was delivered forward in deep flexion and external rotation.  A size F tray was selected and pinned into place centered on the medial 1/3 of the tibial tubercle.  The reamer and keel was used to prepare the tibia through the tray.    I then trialed with the size 10 femur, size F tibia, a 10 mm insert and the 35 patella.  I had excellent flexion/extension gap balance, excellent patella tracking.  Flexion was full and beyond 120 degrees; extension was zero.  These components were chosen and the staff opened them to me on the back table while the knee was lavaged copiously and the cement mixed.  The soft tissue was infiltrated with 60cc of exparel 1.3% through a 21 gauge needle.  I cemented in the components and removed all excess cement.  The polyethylene tibial component was snapped into place and the knee placed in extension while cement was hardening.  The capsule was infilltrated with a 60cc exparel/marcaine/saline mixture.   Once the cement was hard, the tourniquet was let down.  Hemostasis was obtained.  The arthrotomy was closed using a #1 stratofix running suture.  The deep soft tissues were closed with #0 vicryls and the subcuticular layer closed with #2-0 vicryl.  The skin was reapproximated and closed with 3.0 Monocryl.  The wound was covered with steristrips, aquacel dressing, and a TED stocking.   The patient was then awakened, extubated, and taken to the recovery room in stable condition.  BLOOD LOSS:  945OP COMPLICATIONS:  None.  PLAN OF CARE: Admit for overnight observation  PATIENT DISPOSITION:  PACU - hemodynamically stable.   Delay start of Pharmacological VTE agent (>24hrs) due to surgical blood loss or risk of bleeding:  not applicable  Please fax a copy of this op note to my office at [phone] (please only include page 1 and 2 of the Case Information op  note)

## 2018-07-05 NOTE — Progress Notes (Signed)
Physical Therapy Treatment Patient Details Name: Timothy Wolfe MRN: [id number] DOB: Jan 29, 1951 Today's Date: 07/05/2018    History of Present Illness 67 YO male s/p L TKR on 07/04/18. PMH includes DMII, HTN, prostate cancer.     PT Comments    POD # 1 pm session Assisted with amb a greater distance, practiced stairs then finished remaining TKR TE's following HEP handout. Pt has met goals to safety D/C to home today.   Follow Up Recommendations  Follow surgeon's recommendation for DC plan and follow-up therapies;Supervision for mobility/OOB     Equipment Recommendations  None recommended by PT    Recommendations for Other Services       Precautions / Restrictions Precautions Precautions: Fall Restrictions Weight Bearing Restrictions: No    Mobility  Bed Mobility Overal bed mobility: Needs Assistance Bed Mobility: Supine to Sit     Supine to sit: Supervision;Min guard     General bed mobility comments: OOB in recliner   Transfers Overall transfer level: Needs assistance Equipment used: Rolling walker (2 wheeled) Transfers: Sit to/from [facility] Sit to Stand: Min guard;From elevated surface;+2 safety/equipment Stand pivot transfers: Supervision       General transfer comment: <25% VC's on safetywith turns and L LE advancement prior to sit/stand  Ambulation/Gait Ambulation/Gait assistance: Supervision Gait Distance (Feet): 120 Feet Assistive device: Rolling walker (2 wheeled) Gait Pattern/deviations: Step-to pattern;Decreased stance time - left;Trunk flexed Gait velocity: decreased   General Gait Details: 25% VC's on proper walker to self distance and upright posture.     Stairs Stairs: Yes Stairs assistance: Supervision;Min guard Stair Management: No rails;Step to pattern;With walker Number of Stairs: 1 General stair comments: 25% VC's on proper sequencing and safety with walker placement   Wheelchair Mobility    Modified Rankin  (Stroke Patients Only)       Balance                                            Cognition Arousal/Alertness: Awake/alert Behavior During Therapy: WFL for tasks assessed/performed Overall Cognitive Status: Within Functional Limits for tasks assessed                                        Exercises  knee bends  assisted knee bends  LAQ's    General Comments        Pertinent Vitals/Pain Pain Assessment: No/denies pain Pain Score: 4  Pain Location: L knee  Pain Descriptors / Indicators: Operative site guarding;Tender Pain Intervention(s): Monitored during session;Repositioned;Ice applied    Home Living                      Prior Function            PT Goals (current goals can now be found in the care plan section) Progress towards PT goals: Progressing toward goals    Frequency    7X/week      PT Plan Current plan remains appropriate    Co-evaluation              AM-PAC PT "6 Clicks" Daily Activity  Outcome Measure  Difficulty turning over in bed (including adjusting bedclothes, sheets and blankets)?: A Lot Difficulty moving from lying on back to sitting on the side of the bed? :  A Lot Difficulty sitting down on and standing up from a chair with arms (e.g., wheelchair, bedside commode, etc,.)?: A Lot Help needed moving to and from a bed to chair (including a wheelchair)?: A Lot Help needed walking in hospital room?: A Lot Help needed climbing 3-5 steps with a railing? : Total 6 Click Score: 11    End of Session Equipment Utilized During Treatment: Gait belt Activity Tolerance: Patient tolerated treatment well;Treatment limited secondary to medical complications (Comment) Patient left: in chair;with chair alarm set;with call bell/phone within reach;with family/visitor present Nurse Communication: Mobility status PT Visit Diagnosis: Other abnormalities of gait and mobility (R26.89);Difficulty in walking,  not elsewhere classified (R26.2)     Time: 8006-3494 PT Time Calculation (min) (ACUTE ONLY): 45 min  Charges:  $Gait Training: 8-22 mins $Therapeutic Exercise: 8-22 mins $Therapeutic Activity: 8-22 mins                     Rica Koyanagi  PTA Acute  Rehabilitation Services Pager      [phone] Office      [phone]

## 2018-07-05 NOTE — Discharge Summary (Signed)
SPORTS MEDICINE & JOINT REPLACEMENT   Lara Mulch, MD   Carlyon Shadow, PA-C [place], [place], [place]  [address]                             [phone]  PATIENT ID: Timothy Wolfe        MRN:  [id number]          DOB/AGE: 30-Dec-1950 / 67 y.o.    DISCHARGE SUMMARY  ADMISSION DATE:    07/04/2018 DISCHARGE DATE:   07/05/2018   ADMISSION DIAGNOSIS: S/P total knee replacement [Z96.659]    DISCHARGE DIAGNOSIS:  primary osteoarthritis left knee    ADDITIONAL DIAGNOSIS: Active Problems:   S/P total knee replacement  Past Medical History:  Diagnosis Date  . Acid reflux   . Arthritis   . Cancer [facility])    prostate -seed radiation  . Diabetes mellitus type 2, insulin dependent ([place])   . Hypertension     PROCEDURE: Procedure(s): LEFT TOTAL KNEE ARTHROPLASTY on 07/04/2018  CONSULTS:    HISTORY:  See H&P in chart  HOSPITAL COURSE:  Timothy Wolfe is a 67 y.o. admitted on 07/04/2018 and found to have a diagnosis of primary osteoarthritis left knee.  After appropriate laboratory studies were obtained  they were taken to the operating room on 07/04/2018 and underwent Procedure(s): LEFT TOTAL KNEE ARTHROPLASTY.   They were given perioperative antibiotics:  Anti-infectives (From admission, onward)   Start     Dose/Rate Route Frequency Ordered Stop   07/04/18 1700  ceFAZolin (ANCEF) IVPB 2g/100 mL premix     2 g 200 mL/hr over 30 Minutes Intravenous Every 6 hours 07/04/18 1338 07/04/18 2300   07/04/18 0900  ceFAZolin (ANCEF) IVPB 2g/100 mL premix     2 g 200 mL/hr over 30 Minutes Intravenous On call to O.R. 07/04/18 0851 07/04/18 1053   07/04/18 0855  ceFAZolin (ANCEF) 2-4 GM/100ML-% IVPB    Note to Pharmacy:  Waldron Session   : cabinet override      07/04/18 0855 07/04/18 1053    .  Patient given tranexamic acid IV or topical and exparel intra-operatively.  Tolerated the procedure well.    POD# 1: Vital signs were stable.  Patient denied Chest pain, shortness of  breath, or calf pain.  Patient was started on Aspirin twice daily at 8am.  Consults to PT, OT, and care management were made.  The patient was weight bearing as tolerated.  CPM was placed on the operative leg 0-90 degrees for 6-8 hours a day. When out of the CPM, patient was placed in the foam block to achieve full extension. Incentive spirometry was taught.  Dressing was changed.       POD #2, Continued  PT for ambulation and exercise program.  IV saline locked.  O2 discontinued.    The remainder of the hospital course was dedicated to ambulation and strengthening.   The patient was discharged on 1 Day Post-Op in  Good condition.  Blood products given:none  DIAGNOSTIC STUDIES: Recent vital signs:  Patient Vitals for the past 24 hrs:  BP Temp Temp src Pulse Resp SpO2  07/05/18 0955 (!) 141/73 98.7 F (37.1 C) Oral 65 15 97 %  07/05/18 0535 124/67 98 F (36.7 C) Oral 66 - 97 %  07/05/18 0114 109/67 97.6 F (36.4 C) Oral 69 17 97 %  07/04/18 2115 126/73 97.9 F (36.6 C) Oral 79 16 98 %  07/04/18  1641 129/66 97.7 F (36.5 C) Oral 74 14 96 %  07/04/18 1548 137/79 (!) 97.4 F (36.3 C) Oral 72 14 97 %  07/04/18 1427 (!) 146/92 97.6 F (36.4 C) Oral 64 16 97 %  07/04/18 1336 (!) 142/83 97.6 F (36.4 C) Oral 64 15 97 %  07/04/18 1315 134/83 (!) 97.5 F (36.4 C) - 64 12 95 %  07/04/18 1300 134/79 - - 72 15 96 %  07/04/18 1245 123/82 - - 75 12 100 %  07/04/18 1230 121/77 - - 75 16 100 %  07/04/18 1215 115/71 (!) 97.5 F (36.4 C) - 82 17 97 %       Recent laboratory studies: Recent Labs    07/05/18 0508  WBC 15.1*  HGB 12.9*  HCT 38.0*  PLT 185   Recent Labs    07/05/18 0508  NA 140  K 3.4*  CL 106  CO2 24  BUN 25*  CREATININE 1.40*  GLUCOSE 208*  CALCIUM 8.5*   Lab Results  Component Value Date   INR 1.06 05/27/2012     Recent Radiographic Studies :  No results found.  DISCHARGE INSTRUCTIONS: Discharge Instructions    Call MD / Call 911   Complete by:  As  directed    If you experience chest pain or shortness of breath, CALL 911 and be transported to the hospital emergency room.  If you develope a fever above 101 F, pus (white drainage) or increased drainage or redness at the wound, or calf pain, call your surgeon's office.   Constipation Prevention   Complete by:  As directed    Drink plenty of fluids.  Prune juice may be helpful.  You may use a stool softener, such as Colace (over the counter) 100 mg twice a day.  Use MiraLax (over the counter) for constipation as needed.   Diet - low sodium heart healthy   Complete by:  As directed    Discharge instructions   Complete by:  As directed    INSTRUCTIONS AFTER JOINT REPLACEMENT   Remove items at home which could result in a fall. This includes throw rugs or furniture in walking pathways ICE to the affected joint every three hours while awake for 30 minutes at a time, for at least the first 3-5 days, and then as needed for pain and swelling.  Continue to use ice for pain and swelling. You may notice swelling that will progress down to the foot and ankle.  This is normal after surgery.  Elevate your leg when you are not up walking on it.   Continue to use the breathing machine you got in the hospital (incentive spirometer) which will help keep your temperature down.  It is common for your temperature to cycle up and down following surgery, especially at night when you are not up moving around and exerting yourself.  The breathing machine keeps your lungs expanded and your temperature down.   DIET:  As you were doing prior to hospitalization, we recommend a well-balanced diet.  DRESSING / WOUND CARE / SHOWERING  Keep the surgical dressing until follow up.  The dressing is water proof, so you can shower without any extra covering.  IF THE DRESSING FALLS OFF or the wound gets wet inside, change the dressing with sterile gauze.  Please use good hand washing techniques before changing the dressing.  Do  not use any lotions or creams on the incision until instructed by your surgeon.  ACTIVITY  Increase activity slowly as tolerated, but follow the weight bearing instructions below.   No driving for 6 weeks or until further direction given by your physician.  You cannot drive while taking narcotics.  No lifting or carrying greater than 10 lbs. until further directed by your surgeon. Avoid periods of inactivity such as sitting longer than an hour when not asleep. This helps prevent blood clots.  You may return to work once you are authorized by your doctor.     WEIGHT BEARING   Weight bearing as tolerated with assist device (walker, cane, etc) as directed, use it as long as suggested by your surgeon or therapist, typically at least 4-6 weeks.   EXERCISES  Results after joint replacement surgery are often greatly improved when you follow the exercise, range of motion and muscle strengthening exercises prescribed by your doctor. Safety measures are also important to protect the joint from further injury. Any time any of these exercises cause you to have increased pain or swelling, decrease what you are doing until you are comfortable again and then slowly increase them. If you have problems or questions, call your caregiver or physical therapist for advice.   Rehabilitation is important following a joint replacement. After just a few days of immobilization, the muscles of the leg can become weakened and shrink (atrophy).  These exercises are designed to build up the tone and strength of the thigh and leg muscles and to improve motion. Often times heat used for twenty to thirty minutes before working out will loosen up your tissues and help with improving the range of motion but do not use heat for the first two weeks following surgery (sometimes heat can increase post-operative swelling).   These exercises can be done on a training (exercise) mat, on the floor, on a table or on a bed. Use  whatever works the best and is most comfortable for you.    Use music or television while you are exercising so that the exercises are a pleasant break in your day. This will make your life better with the exercises acting as a break in your routine that you can look forward to.   Perform all exercises about fifteen times, three times per day or as directed.  You should exercise both the operative leg and the other leg as well.   Exercises include:   Quad Sets - Tighten up the muscle on the front of the thigh (Quad) and hold for 5-10 seconds.   Straight Leg Raises - With your knee straight (if you were given a brace, keep it on), lift the leg to 60 degrees, hold for 3 seconds, and slowly lower the leg.  Perform this exercise against resistance later as your leg gets stronger.  Leg Slides: Lying on your back, slowly slide your foot toward your buttocks, bending your knee up off the floor (only go as far as is comfortable). Then slowly slide your foot back down until your leg is flat on the floor again.  Angel Wings: Lying on your back spread your legs to the side as far apart as you can without causing discomfort.  Hamstring Strength:  Lying on your back, push your heel against the floor with your leg straight by tightening up the muscles of your buttocks.  Repeat, but this time bend your knee to a comfortable angle, and push your heel against the floor.  You may put a pillow under the heel to make it more comfortable if necessary.  A rehabilitation program following joint replacement surgery can speed recovery and prevent re-injury in the future due to weakened muscles. Contact your doctor or a physical therapist for more information on knee rehabilitation.    CONSTIPATION  Constipation is defined medically as fewer than three stools per week and severe constipation as less than one stool per week.  Even if you have a regular bowel pattern at home, your normal regimen is likely to be disrupted due to  multiple reasons following surgery.  Combination of anesthesia, postoperative narcotics, change in appetite and fluid intake all can affect your bowels.   YOU MUST use at least one of the following options; they are listed in order of increasing strength to get the job done.  They are all available over the counter, and you may need to use some, POSSIBLY even all of these options:    Drink plenty of fluids (prune juice may be helpful) and high fiber foods Colace 100 mg by mouth twice a day  Senokot for constipation as directed and as needed Dulcolax (bisacodyl), take with full glass of water  Miralax (polyethylene glycol) once or twice a day as needed.  If you have tried all these things and are unable to have a bowel movement in the first 3-4 days after surgery call either your surgeon or your primary doctor.    If you experience loose stools or diarrhea, hold the medications until you stool forms back up.  If your symptoms do not get better within 1 week or if they get worse, check with your doctor.  If you experience "the worst abdominal pain ever" or develop nausea or vomiting, please contact the office immediately for further recommendations for treatment.   ITCHING:  If you experience itching with your medications, try taking only a single pain pill, or even half a pain pill at a time.  You can also use Benadryl over the counter for itching or also to help with sleep.   TED HOSE STOCKINGS:  Use stockings on both legs until for at least 2 weeks or as directed by physician office. They may be removed at night for sleeping.  MEDICATIONS:  See your medication summary on the "After Visit Summary" that nursing will review with you.  You may have some home medications which will be placed on hold until you complete the course of blood thinner medication.  It is important for you to complete the blood thinner medication as prescribed.  PRECAUTIONS:  If you experience chest pain or shortness of  breath - call 911 immediately for transfer to the hospital emergency department.   If you develop a fever greater that 101 F, purulent drainage from wound, increased redness or drainage from wound, foul odor from the wound/dressing, or calf pain - CONTACT YOUR SURGEON.                                                   FOLLOW-UP APPOINTMENTS:  If you do not already have a post-op appointment, please call the office for an appointment to be seen by your surgeon.  Guidelines for how soon to be seen are listed in your "After Visit Summary", but are typically between 1-4 weeks after surgery.  OTHER INSTRUCTIONS:   Knee Replacement:  Do not place pillow under knee, focus on keeping the knee straight while resting.  CPM instructions: 0-90 degrees, 2 hours in the morning, 2 hours in the afternoon, and 2 hours in the evening. Place foam block, curve side up under heel at all times except when in CPM or when walking.  DO NOT modify, tear, cut, or change the foam block in any way.  MAKE SURE YOU:  Understand these instructions.  Get help right away if you are not doing well or get worse.    Thank you for letting us be a part of your medical care team.  It is a privilege we respect greatly.  We hope these instructions will help you stay on track for a fast and full recovery!   Increase activity slowly as tolerated   Complete by:  As directed       DISCHARGE MEDICATIONS:   Allergies as of 07/05/2018   No Known Allergies     Medication List    STOP taking these medications   aspirin 81 MG tablet Replaced by:  aspirin 325 MG EC tablet     TAKE these medications   acetaminophen 500 MG tablet Commonly known as:  TYLENOL Take 2 tablets (1,000 mg total) by mouth every 6 (six) hours.   aspirin 325 MG EC tablet Take 1 tablet (325 mg total) by mouth 2 (two) times daily. Replaces:  aspirin 81 MG tablet   cloNIDine 0.1 MG tablet Commonly known as:  CATAPRES Take 0.1-0.2 mg by mouth 2 (two) times  daily. Take 0.1mg  by mouth in the morning and 0.2mg  in the evening   furosemide 20 MG tablet Commonly known as:  LASIX Take 20-40 mg by mouth daily. Take 40mg  by mouth in the morning and 20mg  in the evening   glimepiride 2 MG tablet Commonly known as:  AMARYL Take 2 mg by mouth 2 (two) times daily.   hydrALAZINE 100 MG tablet Commonly known as:  APRESOLINE Take 100 mg by mouth 3 (three) times daily.   insulin aspart 100 UNIT/ML injection Commonly known as:  novoLOG Inject 15-24 Units into the skin 3 (three) times daily before meals. Inject 18 units subcutaneously in morning, 15 units at lunch and 24 units in the evening   insulin detemir 100 UNIT/ML injection Commonly known as:  LEVEMIR Inject 25 Units into the skin 2 (two) times daily.   liraglutide 18 MG/3ML Sopn Commonly known as:  VICTOZA Inject 1.8 mg into the skin daily after breakfast.   lisinopril 40 MG tablet Commonly known as:  PRINIVIL,ZESTRIL Take 40 mg by mouth daily.   Melatonin 5 MG Tabs Take 1.25 mg by mouth at bedtime as needed (sleep).   methocarbamol 500 MG tablet Commonly known as:  ROBAXIN Take 1-2 tablets (500-1,000 mg total) by mouth every 6 (six) hours as needed for muscle spasms.   metoprolol tartrate 100 MG tablet Commonly known as:  LOPRESSOR Take 100 mg by mouth Twice daily.   oxyCODONE 5 MG immediate release tablet Commonly known as:  Oxy IR/ROXICODONE Take 1-2 tablets (5-10 mg total) by mouth every 6 (six) hours as needed for moderate pain (pain score 4-6).   potassium chloride SA 20 MEQ tablet Commonly known as:  K-DUR,KLOR-CON Take 20 mEq by mouth 2 (two) times daily.   ranitidine 150 MG tablet Commonly known as:  ZANTAC Take 150 mg by mouth daily.   rosuvastatin 10 MG tablet Commonly known as:  CRESTOR Take 10 mg by mouth every evening.            Durable Medical Equipment  (  From admission, onward)         Start     Ordered   07/04/18 1339  DME 3 n 1  Once      07/04/18 1338   07/04/18 1339  DME Bedside commode  Once    Question:  Patient needs a bedside commode to treat with the following condition  Answer:  S/P total knee replacement   07/04/18 1338   07/04/18 1339  DME Walker rolling  Once    Question:  Patient needs a walker to treat with the following condition  Answer:  S/P total knee replacement   07/04/18 1338          FOLLOW UP VISIT:   Follow-up Information    [facility] Follow up.   Specialty:  [place] Why:  physical therapy Contact information: [address] [place] [address] [phone]           DISPOSITION: HOME VS. SNF  CONDITION:  Keenan Bachelor 07/05/2018, 12:06 PM

## 2018-07-06 DIAGNOSIS — I10 Essential (primary) hypertension: Secondary | ICD-10-CM | POA: Diagnosis not present

## 2018-07-06 DIAGNOSIS — Z96652 Presence of left artificial knee joint: Secondary | ICD-10-CM | POA: Diagnosis not present

## 2018-07-06 DIAGNOSIS — Z794 Long term (current) use of insulin: Secondary | ICD-10-CM | POA: Diagnosis not present

## 2018-07-06 DIAGNOSIS — Z471 Aftercare following joint replacement surgery: Secondary | ICD-10-CM | POA: Diagnosis not present

## 2018-07-06 DIAGNOSIS — Z9181 History of falling: Secondary | ICD-10-CM | POA: Diagnosis not present

## 2018-07-06 DIAGNOSIS — Z8546 Personal history of malignant neoplasm of prostate: Secondary | ICD-10-CM | POA: Diagnosis not present

## 2018-07-06 DIAGNOSIS — E119 Type 2 diabetes mellitus without complications: Secondary | ICD-10-CM | POA: Diagnosis not present

## 2018-07-06 DIAGNOSIS — Z7982 Long term (current) use of aspirin: Secondary | ICD-10-CM | POA: Diagnosis not present

## 2018-07-07 DIAGNOSIS — Z8546 Personal history of malignant neoplasm of prostate: Secondary | ICD-10-CM | POA: Diagnosis not present

## 2018-07-07 DIAGNOSIS — Z471 Aftercare following joint replacement surgery: Secondary | ICD-10-CM | POA: Diagnosis not present

## 2018-07-07 DIAGNOSIS — Z96652 Presence of left artificial knee joint: Secondary | ICD-10-CM | POA: Diagnosis not present

## 2018-07-07 DIAGNOSIS — Z794 Long term (current) use of insulin: Secondary | ICD-10-CM | POA: Diagnosis not present

## 2018-07-07 DIAGNOSIS — I10 Essential (primary) hypertension: Secondary | ICD-10-CM | POA: Diagnosis not present

## 2018-07-07 DIAGNOSIS — E119 Type 2 diabetes mellitus without complications: Secondary | ICD-10-CM | POA: Diagnosis not present

## 2018-07-08 DIAGNOSIS — E119 Type 2 diabetes mellitus without complications: Secondary | ICD-10-CM | POA: Diagnosis not present

## 2018-07-08 DIAGNOSIS — Z8546 Personal history of malignant neoplasm of prostate: Secondary | ICD-10-CM | POA: Diagnosis not present

## 2018-07-08 DIAGNOSIS — I10 Essential (primary) hypertension: Secondary | ICD-10-CM | POA: Diagnosis not present

## 2018-07-08 DIAGNOSIS — Z96652 Presence of left artificial knee joint: Secondary | ICD-10-CM | POA: Diagnosis not present

## 2018-07-08 DIAGNOSIS — Z794 Long term (current) use of insulin: Secondary | ICD-10-CM | POA: Diagnosis not present

## 2018-07-08 DIAGNOSIS — Z471 Aftercare following joint replacement surgery: Secondary | ICD-10-CM | POA: Diagnosis not present

## 2018-07-11 DIAGNOSIS — Z794 Long term (current) use of insulin: Secondary | ICD-10-CM | POA: Diagnosis not present

## 2018-07-11 DIAGNOSIS — Z471 Aftercare following joint replacement surgery: Secondary | ICD-10-CM | POA: Diagnosis not present

## 2018-07-11 DIAGNOSIS — Z96652 Presence of left artificial knee joint: Secondary | ICD-10-CM | POA: Diagnosis not present

## 2018-07-11 DIAGNOSIS — E119 Type 2 diabetes mellitus without complications: Secondary | ICD-10-CM | POA: Diagnosis not present

## 2018-07-11 DIAGNOSIS — I10 Essential (primary) hypertension: Secondary | ICD-10-CM | POA: Diagnosis not present

## 2018-07-11 DIAGNOSIS — Z8546 Personal history of malignant neoplasm of prostate: Secondary | ICD-10-CM | POA: Diagnosis not present

## 2018-07-13 DIAGNOSIS — Z96652 Presence of left artificial knee joint: Secondary | ICD-10-CM | POA: Diagnosis not present

## 2018-07-13 DIAGNOSIS — Z471 Aftercare following joint replacement surgery: Secondary | ICD-10-CM | POA: Diagnosis not present

## 2018-07-13 DIAGNOSIS — I10 Essential (primary) hypertension: Secondary | ICD-10-CM | POA: Diagnosis not present

## 2018-07-13 DIAGNOSIS — Z794 Long term (current) use of insulin: Secondary | ICD-10-CM | POA: Diagnosis not present

## 2018-07-13 DIAGNOSIS — Z8546 Personal history of malignant neoplasm of prostate: Secondary | ICD-10-CM | POA: Diagnosis not present

## 2018-07-13 DIAGNOSIS — E119 Type 2 diabetes mellitus without complications: Secondary | ICD-10-CM | POA: Diagnosis not present

## 2018-07-14 DIAGNOSIS — Z96652 Presence of left artificial knee joint: Secondary | ICD-10-CM | POA: Diagnosis not present

## 2018-07-15 DIAGNOSIS — R2689 Other abnormalities of gait and mobility: Secondary | ICD-10-CM | POA: Diagnosis not present

## 2018-07-15 DIAGNOSIS — M62552 Muscle wasting and atrophy, not elsewhere classified, left thigh: Secondary | ICD-10-CM | POA: Diagnosis not present

## 2018-07-15 DIAGNOSIS — Z96652 Presence of left artificial knee joint: Secondary | ICD-10-CM | POA: Diagnosis not present

## 2018-07-15 DIAGNOSIS — M1712 Unilateral primary osteoarthritis, left knee: Secondary | ICD-10-CM | POA: Diagnosis not present

## 2018-07-18 DIAGNOSIS — M1712 Unilateral primary osteoarthritis, left knee: Secondary | ICD-10-CM | POA: Diagnosis not present

## 2018-07-18 DIAGNOSIS — M62552 Muscle wasting and atrophy, not elsewhere classified, left thigh: Secondary | ICD-10-CM | POA: Diagnosis not present

## 2018-07-18 DIAGNOSIS — Z96652 Presence of left artificial knee joint: Secondary | ICD-10-CM | POA: Diagnosis not present

## 2018-07-18 DIAGNOSIS — R2689 Other abnormalities of gait and mobility: Secondary | ICD-10-CM | POA: Diagnosis not present

## 2018-07-21 DIAGNOSIS — Z96652 Presence of left artificial knee joint: Secondary | ICD-10-CM | POA: Diagnosis not present

## 2018-07-21 DIAGNOSIS — R2689 Other abnormalities of gait and mobility: Secondary | ICD-10-CM | POA: Diagnosis not present

## 2018-07-21 DIAGNOSIS — M62552 Muscle wasting and atrophy, not elsewhere classified, left thigh: Secondary | ICD-10-CM | POA: Diagnosis not present

## 2018-07-21 DIAGNOSIS — M1712 Unilateral primary osteoarthritis, left knee: Secondary | ICD-10-CM | POA: Diagnosis not present

## 2018-07-25 DIAGNOSIS — R2689 Other abnormalities of gait and mobility: Secondary | ICD-10-CM | POA: Diagnosis not present

## 2018-07-25 DIAGNOSIS — Z96652 Presence of left artificial knee joint: Secondary | ICD-10-CM | POA: Diagnosis not present

## 2018-07-25 DIAGNOSIS — M62552 Muscle wasting and atrophy, not elsewhere classified, left thigh: Secondary | ICD-10-CM | POA: Diagnosis not present

## 2018-07-25 DIAGNOSIS — M1712 Unilateral primary osteoarthritis, left knee: Secondary | ICD-10-CM | POA: Diagnosis not present

## 2018-07-28 DIAGNOSIS — R2689 Other abnormalities of gait and mobility: Secondary | ICD-10-CM | POA: Diagnosis not present

## 2018-07-28 DIAGNOSIS — M1712 Unilateral primary osteoarthritis, left knee: Secondary | ICD-10-CM | POA: Diagnosis not present

## 2018-07-28 DIAGNOSIS — Z96652 Presence of left artificial knee joint: Secondary | ICD-10-CM | POA: Diagnosis not present

## 2018-07-28 DIAGNOSIS — M62552 Muscle wasting and atrophy, not elsewhere classified, left thigh: Secondary | ICD-10-CM | POA: Diagnosis not present

## 2018-08-02 DIAGNOSIS — E119 Type 2 diabetes mellitus without complications: Secondary | ICD-10-CM | POA: Diagnosis not present

## 2018-08-02 DIAGNOSIS — M1712 Unilateral primary osteoarthritis, left knee: Secondary | ICD-10-CM | POA: Diagnosis not present

## 2018-08-04 DIAGNOSIS — M1712 Unilateral primary osteoarthritis, left knee: Secondary | ICD-10-CM | POA: Diagnosis not present

## 2018-08-08 DIAGNOSIS — M1712 Unilateral primary osteoarthritis, left knee: Secondary | ICD-10-CM | POA: Diagnosis not present

## 2018-08-11 DIAGNOSIS — M1712 Unilateral primary osteoarthritis, left knee: Secondary | ICD-10-CM | POA: Diagnosis not present

## 2018-08-15 DIAGNOSIS — M1712 Unilateral primary osteoarthritis, left knee: Secondary | ICD-10-CM | POA: Diagnosis not present

## 2018-08-19 DIAGNOSIS — M1712 Unilateral primary osteoarthritis, left knee: Secondary | ICD-10-CM | POA: Diagnosis not present

## 2018-08-23 DIAGNOSIS — M1712 Unilateral primary osteoarthritis, left knee: Secondary | ICD-10-CM | POA: Diagnosis not present

## 2018-08-29 DIAGNOSIS — M1712 Unilateral primary osteoarthritis, left knee: Secondary | ICD-10-CM | POA: Diagnosis not present

## 2018-09-01 DIAGNOSIS — M1712 Unilateral primary osteoarthritis, left knee: Secondary | ICD-10-CM | POA: Diagnosis not present

## 2018-09-02 DIAGNOSIS — E118 Type 2 diabetes mellitus with unspecified complications: Secondary | ICD-10-CM | POA: Diagnosis not present

## 2018-09-02 DIAGNOSIS — N183 Chronic kidney disease, stage 3 (moderate): Secondary | ICD-10-CM | POA: Diagnosis not present

## 2018-09-02 DIAGNOSIS — I10 Essential (primary) hypertension: Secondary | ICD-10-CM | POA: Diagnosis not present

## 2018-09-02 DIAGNOSIS — E1121 Type 2 diabetes mellitus with diabetic nephropathy: Secondary | ICD-10-CM | POA: Diagnosis not present

## 2018-09-02 DIAGNOSIS — F325 Major depressive disorder, single episode, in full remission: Secondary | ICD-10-CM | POA: Diagnosis not present

## 2018-09-02 DIAGNOSIS — Z23 Encounter for immunization: Secondary | ICD-10-CM | POA: Diagnosis not present

## 2018-09-02 DIAGNOSIS — C61 Malignant neoplasm of prostate: Secondary | ICD-10-CM | POA: Diagnosis not present

## 2018-09-05 DIAGNOSIS — M1712 Unilateral primary osteoarthritis, left knee: Secondary | ICD-10-CM | POA: Diagnosis not present

## 2018-09-08 DIAGNOSIS — M1712 Unilateral primary osteoarthritis, left knee: Secondary | ICD-10-CM | POA: Diagnosis not present

## 2018-10-04 DIAGNOSIS — F325 Major depressive disorder, single episode, in full remission: Secondary | ICD-10-CM | POA: Diagnosis not present

## 2018-10-04 DIAGNOSIS — J399 Disease of upper respiratory tract, unspecified: Secondary | ICD-10-CM | POA: Diagnosis not present

## 2018-10-04 DIAGNOSIS — E118 Type 2 diabetes mellitus with unspecified complications: Secondary | ICD-10-CM | POA: Diagnosis not present

## 2018-10-04 DIAGNOSIS — C61 Malignant neoplasm of prostate: Secondary | ICD-10-CM | POA: Diagnosis not present

## 2018-10-05 DIAGNOSIS — E785 Hyperlipidemia, unspecified: Secondary | ICD-10-CM | POA: Diagnosis not present

## 2018-10-05 DIAGNOSIS — Z9181 History of falling: Secondary | ICD-10-CM | POA: Diagnosis not present

## 2018-10-05 DIAGNOSIS — Z1331 Encounter for screening for depression: Secondary | ICD-10-CM | POA: Diagnosis not present

## 2018-10-05 DIAGNOSIS — Z0000 Encounter for general adult medical examination without abnormal findings: Secondary | ICD-10-CM | POA: Diagnosis not present

## 2018-10-05 DIAGNOSIS — Z1211 Encounter for screening for malignant neoplasm of colon: Secondary | ICD-10-CM | POA: Diagnosis not present

## 2018-10-05 DIAGNOSIS — Z125 Encounter for screening for malignant neoplasm of prostate: Secondary | ICD-10-CM | POA: Diagnosis not present

## 2018-12-05 DIAGNOSIS — E785 Hyperlipidemia, unspecified: Secondary | ICD-10-CM | POA: Diagnosis not present

## 2018-12-05 DIAGNOSIS — C61 Malignant neoplasm of prostate: Secondary | ICD-10-CM | POA: Diagnosis not present

## 2018-12-05 DIAGNOSIS — E118 Type 2 diabetes mellitus with unspecified complications: Secondary | ICD-10-CM | POA: Diagnosis not present

## 2018-12-05 DIAGNOSIS — F325 Major depressive disorder, single episode, in full remission: Secondary | ICD-10-CM | POA: Diagnosis not present

## 2018-12-05 DIAGNOSIS — N183 Chronic kidney disease, stage 3 (moderate): Secondary | ICD-10-CM | POA: Diagnosis not present

## 2018-12-05 DIAGNOSIS — E1121 Type 2 diabetes mellitus with diabetic nephropathy: Secondary | ICD-10-CM | POA: Diagnosis not present

## 2018-12-05 DIAGNOSIS — I10 Essential (primary) hypertension: Secondary | ICD-10-CM | POA: Diagnosis not present

## 2019-01-30 DIAGNOSIS — L820 Inflamed seborrheic keratosis: Secondary | ICD-10-CM | POA: Diagnosis not present

## 2019-01-30 DIAGNOSIS — C4441 Basal cell carcinoma of skin of scalp and neck: Secondary | ICD-10-CM | POA: Diagnosis not present

## 2019-01-30 DIAGNOSIS — L570 Actinic keratosis: Secondary | ICD-10-CM | POA: Diagnosis not present

## 2019-01-30 DIAGNOSIS — D045 Carcinoma in situ of skin of trunk: Secondary | ICD-10-CM | POA: Diagnosis not present

## 2019-01-30 DIAGNOSIS — L578 Other skin changes due to chronic exposure to nonionizing radiation: Secondary | ICD-10-CM | POA: Diagnosis not present

## 2019-02-02 DIAGNOSIS — C44519 Basal cell carcinoma of skin of other part of trunk: Secondary | ICD-10-CM | POA: Diagnosis not present

## 2019-03-07 DIAGNOSIS — I10 Essential (primary) hypertension: Secondary | ICD-10-CM | POA: Diagnosis not present

## 2019-03-07 DIAGNOSIS — E1121 Type 2 diabetes mellitus with diabetic nephropathy: Secondary | ICD-10-CM | POA: Diagnosis not present

## 2019-03-07 DIAGNOSIS — Z125 Encounter for screening for malignant neoplasm of prostate: Secondary | ICD-10-CM | POA: Diagnosis not present

## 2019-03-07 DIAGNOSIS — N183 Chronic kidney disease, stage 3 (moderate): Secondary | ICD-10-CM | POA: Diagnosis not present

## 2019-03-07 DIAGNOSIS — E118 Type 2 diabetes mellitus with unspecified complications: Secondary | ICD-10-CM | POA: Diagnosis not present

## 2019-03-07 DIAGNOSIS — C61 Malignant neoplasm of prostate: Secondary | ICD-10-CM | POA: Diagnosis not present

## 2019-03-07 DIAGNOSIS — F325 Major depressive disorder, single episode, in full remission: Secondary | ICD-10-CM | POA: Diagnosis not present

## 2019-03-07 DIAGNOSIS — E785 Hyperlipidemia, unspecified: Secondary | ICD-10-CM | POA: Diagnosis not present

## 2019-03-14 DIAGNOSIS — Z96652 Presence of left artificial knee joint: Secondary | ICD-10-CM | POA: Diagnosis not present

## 2019-03-15 DIAGNOSIS — K219 Gastro-esophageal reflux disease without esophagitis: Secondary | ICD-10-CM | POA: Diagnosis not present

## 2019-03-15 DIAGNOSIS — Z01818 Encounter for other preprocedural examination: Secondary | ICD-10-CM | POA: Diagnosis not present

## 2019-06-08 DIAGNOSIS — E1121 Type 2 diabetes mellitus with diabetic nephropathy: Secondary | ICD-10-CM | POA: Diagnosis not present

## 2019-06-08 DIAGNOSIS — E118 Type 2 diabetes mellitus with unspecified complications: Secondary | ICD-10-CM | POA: Diagnosis not present

## 2019-06-08 DIAGNOSIS — F325 Major depressive disorder, single episode, in full remission: Secondary | ICD-10-CM | POA: Diagnosis not present

## 2019-06-08 DIAGNOSIS — N183 Chronic kidney disease, stage 3 (moderate): Secondary | ICD-10-CM | POA: Diagnosis not present

## 2019-06-08 DIAGNOSIS — C61 Malignant neoplasm of prostate: Secondary | ICD-10-CM | POA: Diagnosis not present

## 2019-06-08 DIAGNOSIS — E785 Hyperlipidemia, unspecified: Secondary | ICD-10-CM | POA: Diagnosis not present

## 2019-06-08 DIAGNOSIS — I10 Essential (primary) hypertension: Secondary | ICD-10-CM | POA: Diagnosis not present

## 2019-07-12 DIAGNOSIS — E119 Type 2 diabetes mellitus without complications: Secondary | ICD-10-CM | POA: Diagnosis not present

## 2019-07-12 DIAGNOSIS — H524 Presbyopia: Secondary | ICD-10-CM | POA: Diagnosis not present

## 2019-08-04 DIAGNOSIS — H9193 Unspecified hearing loss, bilateral: Secondary | ICD-10-CM | POA: Diagnosis not present

## 2019-08-04 DIAGNOSIS — H6121 Impacted cerumen, right ear: Secondary | ICD-10-CM | POA: Diagnosis not present

## 2019-08-22 ENCOUNTER — Telehealth: Payer: Self-pay | Admitting: *Deleted

## 2019-08-22 NOTE — Telephone Encounter (Signed)
Opened by error.

## 2019-09-06 DIAGNOSIS — N183 Chronic kidney disease, stage 3 unspecified: Secondary | ICD-10-CM | POA: Diagnosis not present

## 2019-09-06 DIAGNOSIS — E1121 Type 2 diabetes mellitus with diabetic nephropathy: Secondary | ICD-10-CM | POA: Diagnosis not present

## 2019-09-06 DIAGNOSIS — Z139 Encounter for screening, unspecified: Secondary | ICD-10-CM | POA: Diagnosis not present

## 2019-09-06 DIAGNOSIS — I10 Essential (primary) hypertension: Secondary | ICD-10-CM | POA: Diagnosis not present

## 2019-09-06 DIAGNOSIS — F325 Major depressive disorder, single episode, in full remission: Secondary | ICD-10-CM | POA: Diagnosis not present

## 2019-09-06 DIAGNOSIS — C61 Malignant neoplasm of prostate: Secondary | ICD-10-CM | POA: Diagnosis not present

## 2019-09-06 DIAGNOSIS — E118 Type 2 diabetes mellitus with unspecified complications: Secondary | ICD-10-CM | POA: Diagnosis not present

## 2019-09-06 DIAGNOSIS — E785 Hyperlipidemia, unspecified: Secondary | ICD-10-CM | POA: Diagnosis not present

## 2019-09-06 DIAGNOSIS — Z23 Encounter for immunization: Secondary | ICD-10-CM | POA: Diagnosis not present

## 2019-12-08 DIAGNOSIS — E118 Type 2 diabetes mellitus with unspecified complications: Secondary | ICD-10-CM | POA: Diagnosis not present

## 2019-12-08 DIAGNOSIS — C61 Malignant neoplasm of prostate: Secondary | ICD-10-CM | POA: Diagnosis not present

## 2019-12-08 DIAGNOSIS — E1121 Type 2 diabetes mellitus with diabetic nephropathy: Secondary | ICD-10-CM | POA: Diagnosis not present

## 2019-12-08 DIAGNOSIS — N183 Chronic kidney disease, stage 3 unspecified: Secondary | ICD-10-CM | POA: Diagnosis not present

## 2019-12-08 DIAGNOSIS — F325 Major depressive disorder, single episode, in full remission: Secondary | ICD-10-CM | POA: Diagnosis not present

## 2019-12-08 DIAGNOSIS — I10 Essential (primary) hypertension: Secondary | ICD-10-CM | POA: Diagnosis not present

## 2020-02-06 DIAGNOSIS — L570 Actinic keratosis: Secondary | ICD-10-CM | POA: Diagnosis not present

## 2020-02-06 DIAGNOSIS — D2239 Melanocytic nevi of other parts of face: Secondary | ICD-10-CM | POA: Diagnosis not present

## 2020-02-06 DIAGNOSIS — L219 Seborrheic dermatitis, unspecified: Secondary | ICD-10-CM | POA: Diagnosis not present

## 2020-02-06 DIAGNOSIS — L578 Other skin changes due to chronic exposure to nonionizing radiation: Secondary | ICD-10-CM | POA: Diagnosis not present

## 2020-02-06 DIAGNOSIS — L821 Other seborrheic keratosis: Secondary | ICD-10-CM | POA: Diagnosis not present

## 2020-03-12 DIAGNOSIS — F325 Major depressive disorder, single episode, in full remission: Secondary | ICD-10-CM | POA: Diagnosis not present

## 2020-03-12 DIAGNOSIS — E785 Hyperlipidemia, unspecified: Secondary | ICD-10-CM | POA: Diagnosis not present

## 2020-03-12 DIAGNOSIS — E118 Type 2 diabetes mellitus with unspecified complications: Secondary | ICD-10-CM | POA: Diagnosis not present

## 2020-03-12 DIAGNOSIS — C61 Malignant neoplasm of prostate: Secondary | ICD-10-CM | POA: Diagnosis not present

## 2020-03-12 DIAGNOSIS — N183 Chronic kidney disease, stage 3 unspecified: Secondary | ICD-10-CM | POA: Diagnosis not present

## 2020-03-12 DIAGNOSIS — E1121 Type 2 diabetes mellitus with diabetic nephropathy: Secondary | ICD-10-CM | POA: Diagnosis not present

## 2020-03-12 DIAGNOSIS — Z125 Encounter for screening for malignant neoplasm of prostate: Secondary | ICD-10-CM | POA: Diagnosis not present

## 2020-03-12 DIAGNOSIS — I10 Essential (primary) hypertension: Secondary | ICD-10-CM | POA: Diagnosis not present

## 2020-06-12 DIAGNOSIS — E118 Type 2 diabetes mellitus with unspecified complications: Secondary | ICD-10-CM | POA: Diagnosis not present

## 2020-06-12 DIAGNOSIS — C61 Malignant neoplasm of prostate: Secondary | ICD-10-CM | POA: Diagnosis not present

## 2020-06-12 DIAGNOSIS — N183 Chronic kidney disease, stage 3 unspecified: Secondary | ICD-10-CM | POA: Diagnosis not present

## 2020-06-12 DIAGNOSIS — F325 Major depressive disorder, single episode, in full remission: Secondary | ICD-10-CM | POA: Diagnosis not present

## 2020-06-12 DIAGNOSIS — F419 Anxiety disorder, unspecified: Secondary | ICD-10-CM | POA: Diagnosis not present

## 2020-06-12 DIAGNOSIS — E785 Hyperlipidemia, unspecified: Secondary | ICD-10-CM | POA: Diagnosis not present

## 2020-06-12 DIAGNOSIS — Z6833 Body mass index (BMI) 33.0-33.9, adult: Secondary | ICD-10-CM | POA: Diagnosis not present

## 2020-06-12 DIAGNOSIS — K219 Gastro-esophageal reflux disease without esophagitis: Secondary | ICD-10-CM | POA: Diagnosis not present

## 2020-06-12 DIAGNOSIS — E1121 Type 2 diabetes mellitus with diabetic nephropathy: Secondary | ICD-10-CM | POA: Diagnosis not present

## 2020-06-12 DIAGNOSIS — E669 Obesity, unspecified: Secondary | ICD-10-CM | POA: Diagnosis not present

## 2020-06-12 DIAGNOSIS — I10 Essential (primary) hypertension: Secondary | ICD-10-CM | POA: Diagnosis not present

## 2020-07-08 DIAGNOSIS — N183 Chronic kidney disease, stage 3 unspecified: Secondary | ICD-10-CM | POA: Diagnosis not present

## 2020-07-08 DIAGNOSIS — Z6834 Body mass index (BMI) 34.0-34.9, adult: Secondary | ICD-10-CM | POA: Diagnosis not present

## 2020-07-08 DIAGNOSIS — C61 Malignant neoplasm of prostate: Secondary | ICD-10-CM | POA: Diagnosis not present

## 2020-07-08 DIAGNOSIS — E118 Type 2 diabetes mellitus with unspecified complications: Secondary | ICD-10-CM | POA: Diagnosis not present

## 2020-07-08 DIAGNOSIS — E1121 Type 2 diabetes mellitus with diabetic nephropathy: Secondary | ICD-10-CM | POA: Diagnosis not present

## 2020-07-08 DIAGNOSIS — I10 Essential (primary) hypertension: Secondary | ICD-10-CM | POA: Diagnosis not present

## 2020-07-08 DIAGNOSIS — E669 Obesity, unspecified: Secondary | ICD-10-CM | POA: Diagnosis not present

## 2020-07-08 DIAGNOSIS — F325 Major depressive disorder, single episode, in full remission: Secondary | ICD-10-CM | POA: Diagnosis not present

## 2020-07-08 DIAGNOSIS — F419 Anxiety disorder, unspecified: Secondary | ICD-10-CM | POA: Diagnosis not present

## 2020-07-08 DIAGNOSIS — E785 Hyperlipidemia, unspecified: Secondary | ICD-10-CM | POA: Diagnosis not present

## 2020-07-08 DIAGNOSIS — K219 Gastro-esophageal reflux disease without esophagitis: Secondary | ICD-10-CM | POA: Diagnosis not present

## 2020-07-24 DIAGNOSIS — E119 Type 2 diabetes mellitus without complications: Secondary | ICD-10-CM | POA: Diagnosis not present

## 2020-07-24 DIAGNOSIS — H524 Presbyopia: Secondary | ICD-10-CM | POA: Diagnosis not present

## 2020-08-20 DIAGNOSIS — Z23 Encounter for immunization: Secondary | ICD-10-CM | POA: Diagnosis not present

## 2020-09-11 DIAGNOSIS — F419 Anxiety disorder, unspecified: Secondary | ICD-10-CM | POA: Diagnosis not present

## 2020-09-11 DIAGNOSIS — E785 Hyperlipidemia, unspecified: Secondary | ICD-10-CM | POA: Diagnosis not present

## 2020-09-11 DIAGNOSIS — F325 Major depressive disorder, single episode, in full remission: Secondary | ICD-10-CM | POA: Diagnosis not present

## 2020-09-11 DIAGNOSIS — Z6834 Body mass index (BMI) 34.0-34.9, adult: Secondary | ICD-10-CM | POA: Diagnosis not present

## 2020-09-11 DIAGNOSIS — E669 Obesity, unspecified: Secondary | ICD-10-CM | POA: Diagnosis not present

## 2020-09-11 DIAGNOSIS — E1121 Type 2 diabetes mellitus with diabetic nephropathy: Secondary | ICD-10-CM | POA: Diagnosis not present

## 2020-09-11 DIAGNOSIS — C61 Malignant neoplasm of prostate: Secondary | ICD-10-CM | POA: Diagnosis not present

## 2020-09-11 DIAGNOSIS — Z139 Encounter for screening, unspecified: Secondary | ICD-10-CM | POA: Diagnosis not present

## 2020-09-11 DIAGNOSIS — N183 Chronic kidney disease, stage 3 unspecified: Secondary | ICD-10-CM | POA: Diagnosis not present

## 2020-09-11 DIAGNOSIS — K219 Gastro-esophageal reflux disease without esophagitis: Secondary | ICD-10-CM | POA: Diagnosis not present

## 2020-09-11 DIAGNOSIS — E1169 Type 2 diabetes mellitus with other specified complication: Secondary | ICD-10-CM | POA: Diagnosis not present

## 2020-09-11 DIAGNOSIS — I10 Essential (primary) hypertension: Secondary | ICD-10-CM | POA: Diagnosis not present

## 2020-10-11 DIAGNOSIS — Z1331 Encounter for screening for depression: Secondary | ICD-10-CM | POA: Diagnosis not present

## 2020-10-11 DIAGNOSIS — E785 Hyperlipidemia, unspecified: Secondary | ICD-10-CM | POA: Diagnosis not present

## 2020-10-11 DIAGNOSIS — Z9181 History of falling: Secondary | ICD-10-CM | POA: Diagnosis not present

## 2020-10-11 DIAGNOSIS — Z0000 Encounter for general adult medical examination without abnormal findings: Secondary | ICD-10-CM | POA: Diagnosis not present

## 2020-10-11 DIAGNOSIS — E669 Obesity, unspecified: Secondary | ICD-10-CM | POA: Diagnosis not present

## 2020-10-17 DIAGNOSIS — D631 Anemia in chronic kidney disease: Secondary | ICD-10-CM | POA: Diagnosis not present

## 2020-10-17 DIAGNOSIS — E669 Obesity, unspecified: Secondary | ICD-10-CM | POA: Diagnosis not present

## 2020-10-17 DIAGNOSIS — F325 Major depressive disorder, single episode, in full remission: Secondary | ICD-10-CM | POA: Diagnosis not present

## 2020-10-17 DIAGNOSIS — F419 Anxiety disorder, unspecified: Secondary | ICD-10-CM | POA: Diagnosis not present

## 2020-10-17 DIAGNOSIS — N183 Chronic kidney disease, stage 3 unspecified: Secondary | ICD-10-CM | POA: Diagnosis not present

## 2020-10-17 DIAGNOSIS — E1169 Type 2 diabetes mellitus with other specified complication: Secondary | ICD-10-CM | POA: Diagnosis not present

## 2020-10-17 DIAGNOSIS — C61 Malignant neoplasm of prostate: Secondary | ICD-10-CM | POA: Diagnosis not present

## 2020-10-17 DIAGNOSIS — Z6834 Body mass index (BMI) 34.0-34.9, adult: Secondary | ICD-10-CM | POA: Diagnosis not present

## 2020-10-17 DIAGNOSIS — I10 Essential (primary) hypertension: Secondary | ICD-10-CM | POA: Diagnosis not present

## 2020-10-17 DIAGNOSIS — K219 Gastro-esophageal reflux disease without esophagitis: Secondary | ICD-10-CM | POA: Diagnosis not present

## 2020-10-17 DIAGNOSIS — R945 Abnormal results of liver function studies: Secondary | ICD-10-CM | POA: Diagnosis not present

## 2020-10-17 DIAGNOSIS — E1121 Type 2 diabetes mellitus with diabetic nephropathy: Secondary | ICD-10-CM | POA: Diagnosis not present

## 2020-12-09 DIAGNOSIS — D631 Anemia in chronic kidney disease: Secondary | ICD-10-CM | POA: Diagnosis not present

## 2020-12-09 DIAGNOSIS — R945 Abnormal results of liver function studies: Secondary | ICD-10-CM | POA: Diagnosis not present

## 2020-12-09 DIAGNOSIS — F325 Major depressive disorder, single episode, in full remission: Secondary | ICD-10-CM | POA: Diagnosis not present

## 2020-12-09 DIAGNOSIS — C61 Malignant neoplasm of prostate: Secondary | ICD-10-CM | POA: Diagnosis not present

## 2020-12-09 DIAGNOSIS — N183 Chronic kidney disease, stage 3 unspecified: Secondary | ICD-10-CM | POA: Diagnosis not present

## 2020-12-09 DIAGNOSIS — E1121 Type 2 diabetes mellitus with diabetic nephropathy: Secondary | ICD-10-CM | POA: Diagnosis not present

## 2020-12-09 DIAGNOSIS — K219 Gastro-esophageal reflux disease without esophagitis: Secondary | ICD-10-CM | POA: Diagnosis not present

## 2020-12-09 DIAGNOSIS — F419 Anxiety disorder, unspecified: Secondary | ICD-10-CM | POA: Diagnosis not present

## 2020-12-09 DIAGNOSIS — E669 Obesity, unspecified: Secondary | ICD-10-CM | POA: Diagnosis not present

## 2020-12-09 DIAGNOSIS — E1169 Type 2 diabetes mellitus with other specified complication: Secondary | ICD-10-CM | POA: Diagnosis not present

## 2020-12-09 DIAGNOSIS — E785 Hyperlipidemia, unspecified: Secondary | ICD-10-CM | POA: Diagnosis not present

## 2020-12-09 DIAGNOSIS — I10 Essential (primary) hypertension: Secondary | ICD-10-CM | POA: Diagnosis not present

## 2020-12-18 DIAGNOSIS — E1121 Type 2 diabetes mellitus with diabetic nephropathy: Secondary | ICD-10-CM | POA: Diagnosis not present

## 2020-12-18 DIAGNOSIS — K219 Gastro-esophageal reflux disease without esophagitis: Secondary | ICD-10-CM | POA: Diagnosis not present

## 2020-12-18 DIAGNOSIS — E669 Obesity, unspecified: Secondary | ICD-10-CM | POA: Diagnosis not present

## 2020-12-18 DIAGNOSIS — C61 Malignant neoplasm of prostate: Secondary | ICD-10-CM | POA: Diagnosis not present

## 2020-12-18 DIAGNOSIS — E785 Hyperlipidemia, unspecified: Secondary | ICD-10-CM | POA: Diagnosis not present

## 2020-12-18 DIAGNOSIS — R945 Abnormal results of liver function studies: Secondary | ICD-10-CM | POA: Diagnosis not present

## 2020-12-18 DIAGNOSIS — I10 Essential (primary) hypertension: Secondary | ICD-10-CM | POA: Diagnosis not present

## 2020-12-18 DIAGNOSIS — E1169 Type 2 diabetes mellitus with other specified complication: Secondary | ICD-10-CM | POA: Diagnosis not present

## 2020-12-18 DIAGNOSIS — D631 Anemia in chronic kidney disease: Secondary | ICD-10-CM | POA: Diagnosis not present

## 2020-12-18 DIAGNOSIS — F419 Anxiety disorder, unspecified: Secondary | ICD-10-CM | POA: Diagnosis not present

## 2020-12-18 DIAGNOSIS — F325 Major depressive disorder, single episode, in full remission: Secondary | ICD-10-CM | POA: Diagnosis not present

## 2020-12-18 DIAGNOSIS — N183 Chronic kidney disease, stage 3 unspecified: Secondary | ICD-10-CM | POA: Diagnosis not present

## 2021-01-01 DIAGNOSIS — E669 Obesity, unspecified: Secondary | ICD-10-CM | POA: Diagnosis not present

## 2021-01-01 DIAGNOSIS — E1121 Type 2 diabetes mellitus with diabetic nephropathy: Secondary | ICD-10-CM | POA: Diagnosis not present

## 2021-01-01 DIAGNOSIS — K219 Gastro-esophageal reflux disease without esophagitis: Secondary | ICD-10-CM | POA: Diagnosis not present

## 2021-01-01 DIAGNOSIS — R945 Abnormal results of liver function studies: Secondary | ICD-10-CM | POA: Diagnosis not present

## 2021-01-01 DIAGNOSIS — C61 Malignant neoplasm of prostate: Secondary | ICD-10-CM | POA: Diagnosis not present

## 2021-01-01 DIAGNOSIS — E1169 Type 2 diabetes mellitus with other specified complication: Secondary | ICD-10-CM | POA: Diagnosis not present

## 2021-01-01 DIAGNOSIS — E785 Hyperlipidemia, unspecified: Secondary | ICD-10-CM | POA: Diagnosis not present

## 2021-01-01 DIAGNOSIS — D631 Anemia in chronic kidney disease: Secondary | ICD-10-CM | POA: Diagnosis not present

## 2021-01-01 DIAGNOSIS — F419 Anxiety disorder, unspecified: Secondary | ICD-10-CM | POA: Diagnosis not present

## 2021-01-01 DIAGNOSIS — N183 Chronic kidney disease, stage 3 unspecified: Secondary | ICD-10-CM | POA: Diagnosis not present

## 2021-01-01 DIAGNOSIS — I10 Essential (primary) hypertension: Secondary | ICD-10-CM | POA: Diagnosis not present

## 2021-01-01 DIAGNOSIS — F325 Major depressive disorder, single episode, in full remission: Secondary | ICD-10-CM | POA: Diagnosis not present

## 2021-02-06 DIAGNOSIS — L578 Other skin changes due to chronic exposure to nonionizing radiation: Secondary | ICD-10-CM | POA: Diagnosis not present

## 2021-02-06 DIAGNOSIS — L820 Inflamed seborrheic keratosis: Secondary | ICD-10-CM | POA: Diagnosis not present

## 2021-02-06 DIAGNOSIS — L821 Other seborrheic keratosis: Secondary | ICD-10-CM | POA: Diagnosis not present

## 2021-02-06 DIAGNOSIS — L570 Actinic keratosis: Secondary | ICD-10-CM | POA: Diagnosis not present

## 2021-02-06 DIAGNOSIS — D485 Neoplasm of uncertain behavior of skin: Secondary | ICD-10-CM | POA: Diagnosis not present

## 2021-03-11 DIAGNOSIS — E1121 Type 2 diabetes mellitus with diabetic nephropathy: Secondary | ICD-10-CM | POA: Diagnosis not present

## 2021-03-11 DIAGNOSIS — K219 Gastro-esophageal reflux disease without esophagitis: Secondary | ICD-10-CM | POA: Diagnosis not present

## 2021-03-11 DIAGNOSIS — E785 Hyperlipidemia, unspecified: Secondary | ICD-10-CM | POA: Diagnosis not present

## 2021-03-11 DIAGNOSIS — F419 Anxiety disorder, unspecified: Secondary | ICD-10-CM | POA: Diagnosis not present

## 2021-03-11 DIAGNOSIS — F325 Major depressive disorder, single episode, in full remission: Secondary | ICD-10-CM | POA: Diagnosis not present

## 2021-03-11 DIAGNOSIS — N183 Chronic kidney disease, stage 3 unspecified: Secondary | ICD-10-CM | POA: Diagnosis not present

## 2021-03-11 DIAGNOSIS — I10 Essential (primary) hypertension: Secondary | ICD-10-CM | POA: Diagnosis not present

## 2021-03-11 DIAGNOSIS — C61 Malignant neoplasm of prostate: Secondary | ICD-10-CM | POA: Diagnosis not present

## 2021-03-11 DIAGNOSIS — E669 Obesity, unspecified: Secondary | ICD-10-CM | POA: Diagnosis not present

## 2021-03-11 DIAGNOSIS — D631 Anemia in chronic kidney disease: Secondary | ICD-10-CM | POA: Diagnosis not present

## 2021-03-11 DIAGNOSIS — E1169 Type 2 diabetes mellitus with other specified complication: Secondary | ICD-10-CM | POA: Diagnosis not present

## 2021-03-11 DIAGNOSIS — R945 Abnormal results of liver function studies: Secondary | ICD-10-CM | POA: Diagnosis not present

## 2021-06-11 DIAGNOSIS — D631 Anemia in chronic kidney disease: Secondary | ICD-10-CM | POA: Diagnosis not present

## 2021-06-11 DIAGNOSIS — I10 Essential (primary) hypertension: Secondary | ICD-10-CM | POA: Diagnosis not present

## 2021-06-11 DIAGNOSIS — N183 Chronic kidney disease, stage 3 unspecified: Secondary | ICD-10-CM | POA: Diagnosis not present

## 2021-06-11 DIAGNOSIS — E1169 Type 2 diabetes mellitus with other specified complication: Secondary | ICD-10-CM | POA: Diagnosis not present

## 2021-06-11 DIAGNOSIS — E785 Hyperlipidemia, unspecified: Secondary | ICD-10-CM | POA: Diagnosis not present

## 2021-06-11 DIAGNOSIS — R945 Abnormal results of liver function studies: Secondary | ICD-10-CM | POA: Diagnosis not present

## 2021-06-11 DIAGNOSIS — E669 Obesity, unspecified: Secondary | ICD-10-CM | POA: Diagnosis not present

## 2021-06-11 DIAGNOSIS — F419 Anxiety disorder, unspecified: Secondary | ICD-10-CM | POA: Diagnosis not present

## 2021-06-11 DIAGNOSIS — C61 Malignant neoplasm of prostate: Secondary | ICD-10-CM | POA: Diagnosis not present

## 2021-06-11 DIAGNOSIS — K219 Gastro-esophageal reflux disease without esophagitis: Secondary | ICD-10-CM | POA: Diagnosis not present

## 2021-06-11 DIAGNOSIS — F325 Major depressive disorder, single episode, in full remission: Secondary | ICD-10-CM | POA: Diagnosis not present

## 2021-06-11 DIAGNOSIS — E1121 Type 2 diabetes mellitus with diabetic nephropathy: Secondary | ICD-10-CM | POA: Diagnosis not present

## 2021-07-28 DIAGNOSIS — E113293 Type 2 diabetes mellitus with mild nonproliferative diabetic retinopathy without macular edema, bilateral: Secondary | ICD-10-CM | POA: Diagnosis not present

## 2021-09-10 DIAGNOSIS — E785 Hyperlipidemia, unspecified: Secondary | ICD-10-CM | POA: Diagnosis not present

## 2021-09-10 DIAGNOSIS — E669 Obesity, unspecified: Secondary | ICD-10-CM | POA: Diagnosis not present

## 2021-09-10 DIAGNOSIS — K219 Gastro-esophageal reflux disease without esophagitis: Secondary | ICD-10-CM | POA: Diagnosis not present

## 2021-09-10 DIAGNOSIS — I10 Essential (primary) hypertension: Secondary | ICD-10-CM | POA: Diagnosis not present

## 2021-09-10 DIAGNOSIS — R945 Abnormal results of liver function studies: Secondary | ICD-10-CM | POA: Diagnosis not present

## 2021-09-10 DIAGNOSIS — D631 Anemia in chronic kidney disease: Secondary | ICD-10-CM | POA: Diagnosis not present

## 2021-09-10 DIAGNOSIS — N183 Chronic kidney disease, stage 3 unspecified: Secondary | ICD-10-CM | POA: Diagnosis not present

## 2021-09-10 DIAGNOSIS — F419 Anxiety disorder, unspecified: Secondary | ICD-10-CM | POA: Diagnosis not present

## 2021-09-10 DIAGNOSIS — C61 Malignant neoplasm of prostate: Secondary | ICD-10-CM | POA: Diagnosis not present

## 2021-09-10 DIAGNOSIS — F325 Major depressive disorder, single episode, in full remission: Secondary | ICD-10-CM | POA: Diagnosis not present

## 2021-09-10 DIAGNOSIS — E1169 Type 2 diabetes mellitus with other specified complication: Secondary | ICD-10-CM | POA: Diagnosis not present

## 2021-09-10 DIAGNOSIS — E1121 Type 2 diabetes mellitus with diabetic nephropathy: Secondary | ICD-10-CM | POA: Diagnosis not present

## 2021-09-15 DIAGNOSIS — D631 Anemia in chronic kidney disease: Secondary | ICD-10-CM | POA: Diagnosis not present

## 2021-09-15 DIAGNOSIS — C61 Malignant neoplasm of prostate: Secondary | ICD-10-CM | POA: Diagnosis not present

## 2021-09-15 DIAGNOSIS — F419 Anxiety disorder, unspecified: Secondary | ICD-10-CM | POA: Diagnosis not present

## 2021-09-15 DIAGNOSIS — N183 Chronic kidney disease, stage 3 unspecified: Secondary | ICD-10-CM | POA: Diagnosis not present

## 2021-09-15 DIAGNOSIS — I10 Essential (primary) hypertension: Secondary | ICD-10-CM | POA: Diagnosis not present

## 2021-09-15 DIAGNOSIS — K219 Gastro-esophageal reflux disease without esophagitis: Secondary | ICD-10-CM | POA: Diagnosis not present

## 2021-09-15 DIAGNOSIS — E1169 Type 2 diabetes mellitus with other specified complication: Secondary | ICD-10-CM | POA: Diagnosis not present

## 2021-09-15 DIAGNOSIS — R945 Abnormal results of liver function studies: Secondary | ICD-10-CM | POA: Diagnosis not present

## 2021-09-15 DIAGNOSIS — E669 Obesity, unspecified: Secondary | ICD-10-CM | POA: Diagnosis not present

## 2021-09-15 DIAGNOSIS — E1121 Type 2 diabetes mellitus with diabetic nephropathy: Secondary | ICD-10-CM | POA: Diagnosis not present

## 2021-09-15 DIAGNOSIS — E785 Hyperlipidemia, unspecified: Secondary | ICD-10-CM | POA: Diagnosis not present

## 2021-09-15 DIAGNOSIS — F325 Major depressive disorder, single episode, in full remission: Secondary | ICD-10-CM | POA: Diagnosis not present

## 2021-10-10 DIAGNOSIS — Z6834 Body mass index (BMI) 34.0-34.9, adult: Secondary | ICD-10-CM | POA: Diagnosis not present

## 2021-10-10 DIAGNOSIS — Z0000 Encounter for general adult medical examination without abnormal findings: Secondary | ICD-10-CM | POA: Diagnosis not present

## 2021-10-15 DIAGNOSIS — Z1331 Encounter for screening for depression: Secondary | ICD-10-CM | POA: Diagnosis not present

## 2021-10-15 DIAGNOSIS — Z0000 Encounter for general adult medical examination without abnormal findings: Secondary | ICD-10-CM | POA: Diagnosis not present

## 2021-10-15 DIAGNOSIS — Z6833 Body mass index (BMI) 33.0-33.9, adult: Secondary | ICD-10-CM | POA: Diagnosis not present

## 2021-10-15 DIAGNOSIS — E669 Obesity, unspecified: Secondary | ICD-10-CM | POA: Diagnosis not present

## 2021-10-15 DIAGNOSIS — E785 Hyperlipidemia, unspecified: Secondary | ICD-10-CM | POA: Diagnosis not present

## 2021-10-15 DIAGNOSIS — Z139 Encounter for screening, unspecified: Secondary | ICD-10-CM | POA: Diagnosis not present

## 2021-10-15 DIAGNOSIS — Z9181 History of falling: Secondary | ICD-10-CM | POA: Diagnosis not present

## 2021-10-21 DIAGNOSIS — H9193 Unspecified hearing loss, bilateral: Secondary | ICD-10-CM | POA: Diagnosis not present

## 2021-10-21 DIAGNOSIS — H6123 Impacted cerumen, bilateral: Secondary | ICD-10-CM | POA: Diagnosis not present

## 2021-10-21 DIAGNOSIS — Z974 Presence of external hearing-aid: Secondary | ICD-10-CM | POA: Diagnosis not present

## 2021-10-28 DIAGNOSIS — F419 Anxiety disorder, unspecified: Secondary | ICD-10-CM | POA: Diagnosis not present

## 2021-10-28 DIAGNOSIS — F325 Major depressive disorder, single episode, in full remission: Secondary | ICD-10-CM | POA: Diagnosis not present

## 2021-10-28 DIAGNOSIS — E1121 Type 2 diabetes mellitus with diabetic nephropathy: Secondary | ICD-10-CM | POA: Diagnosis not present

## 2021-10-28 DIAGNOSIS — E669 Obesity, unspecified: Secondary | ICD-10-CM | POA: Diagnosis not present

## 2021-10-28 DIAGNOSIS — D631 Anemia in chronic kidney disease: Secondary | ICD-10-CM | POA: Diagnosis not present

## 2021-10-28 DIAGNOSIS — I10 Essential (primary) hypertension: Secondary | ICD-10-CM | POA: Diagnosis not present

## 2021-10-28 DIAGNOSIS — K219 Gastro-esophageal reflux disease without esophagitis: Secondary | ICD-10-CM | POA: Diagnosis not present

## 2021-10-28 DIAGNOSIS — E1169 Type 2 diabetes mellitus with other specified complication: Secondary | ICD-10-CM | POA: Diagnosis not present

## 2021-10-28 DIAGNOSIS — C61 Malignant neoplasm of prostate: Secondary | ICD-10-CM | POA: Diagnosis not present

## 2021-10-28 DIAGNOSIS — E785 Hyperlipidemia, unspecified: Secondary | ICD-10-CM | POA: Diagnosis not present

## 2021-10-28 DIAGNOSIS — R945 Abnormal results of liver function studies: Secondary | ICD-10-CM | POA: Diagnosis not present

## 2021-10-28 DIAGNOSIS — N183 Chronic kidney disease, stage 3 unspecified: Secondary | ICD-10-CM | POA: Diagnosis not present

## 2021-10-31 DIAGNOSIS — H6121 Impacted cerumen, right ear: Secondary | ICD-10-CM | POA: Diagnosis not present

## 2021-10-31 DIAGNOSIS — H9193 Unspecified hearing loss, bilateral: Secondary | ICD-10-CM | POA: Diagnosis not present

## 2021-10-31 DIAGNOSIS — Z974 Presence of external hearing-aid: Secondary | ICD-10-CM | POA: Diagnosis not present

## 2021-11-13 DIAGNOSIS — R7989 Other specified abnormal findings of blood chemistry: Secondary | ICD-10-CM | POA: Diagnosis not present

## 2021-11-13 DIAGNOSIS — K219 Gastro-esophageal reflux disease without esophagitis: Secondary | ICD-10-CM | POA: Diagnosis not present

## 2021-11-13 DIAGNOSIS — E119 Type 2 diabetes mellitus without complications: Secondary | ICD-10-CM | POA: Diagnosis not present

## 2021-11-13 DIAGNOSIS — K649 Unspecified hemorrhoids: Secondary | ICD-10-CM | POA: Diagnosis not present

## 2021-11-18 DIAGNOSIS — H40033 Anatomical narrow angle, bilateral: Secondary | ICD-10-CM | POA: Diagnosis not present

## 2021-11-18 DIAGNOSIS — E119 Type 2 diabetes mellitus without complications: Secondary | ICD-10-CM | POA: Diagnosis not present

## 2021-11-18 DIAGNOSIS — H25813 Combined forms of age-related cataract, bilateral: Secondary | ICD-10-CM | POA: Diagnosis not present

## 2021-11-18 DIAGNOSIS — H2513 Age-related nuclear cataract, bilateral: Secondary | ICD-10-CM | POA: Diagnosis not present

## 2021-11-18 DIAGNOSIS — Z01818 Encounter for other preprocedural examination: Secondary | ICD-10-CM | POA: Diagnosis not present

## 2021-11-25 DIAGNOSIS — Z7982 Long term (current) use of aspirin: Secondary | ICD-10-CM | POA: Diagnosis not present

## 2021-11-25 DIAGNOSIS — Z794 Long term (current) use of insulin: Secondary | ICD-10-CM | POA: Diagnosis not present

## 2021-11-25 DIAGNOSIS — H25811 Combined forms of age-related cataract, right eye: Secondary | ICD-10-CM | POA: Diagnosis not present

## 2021-11-25 DIAGNOSIS — H353121 Nonexudative age-related macular degeneration, left eye, early dry stage: Secondary | ICD-10-CM | POA: Diagnosis not present

## 2021-11-25 DIAGNOSIS — H40033 Anatomical narrow angle, bilateral: Secondary | ICD-10-CM | POA: Diagnosis not present

## 2021-11-25 DIAGNOSIS — H259 Unspecified age-related cataract: Secondary | ICD-10-CM | POA: Diagnosis not present

## 2021-11-25 DIAGNOSIS — I10 Essential (primary) hypertension: Secondary | ICD-10-CM | POA: Diagnosis not present

## 2021-11-25 DIAGNOSIS — E1136 Type 2 diabetes mellitus with diabetic cataract: Secondary | ICD-10-CM | POA: Diagnosis not present

## 2021-11-25 DIAGNOSIS — Z7985 Long-term (current) use of injectable non-insulin antidiabetic drugs: Secondary | ICD-10-CM | POA: Diagnosis not present

## 2021-11-25 DIAGNOSIS — E11319 Type 2 diabetes mellitus with unspecified diabetic retinopathy without macular edema: Secondary | ICD-10-CM | POA: Diagnosis not present

## 2021-12-03 DIAGNOSIS — H2512 Age-related nuclear cataract, left eye: Secondary | ICD-10-CM | POA: Diagnosis not present

## 2021-12-03 DIAGNOSIS — H25812 Combined forms of age-related cataract, left eye: Secondary | ICD-10-CM | POA: Diagnosis not present

## 2021-12-10 DIAGNOSIS — C61 Malignant neoplasm of prostate: Secondary | ICD-10-CM | POA: Diagnosis not present

## 2021-12-10 DIAGNOSIS — N183 Chronic kidney disease, stage 3 unspecified: Secondary | ICD-10-CM | POA: Diagnosis not present

## 2021-12-10 DIAGNOSIS — E1121 Type 2 diabetes mellitus with diabetic nephropathy: Secondary | ICD-10-CM | POA: Diagnosis not present

## 2021-12-10 DIAGNOSIS — E1169 Type 2 diabetes mellitus with other specified complication: Secondary | ICD-10-CM | POA: Diagnosis not present

## 2021-12-10 DIAGNOSIS — I10 Essential (primary) hypertension: Secondary | ICD-10-CM | POA: Diagnosis not present

## 2021-12-10 DIAGNOSIS — F325 Major depressive disorder, single episode, in full remission: Secondary | ICD-10-CM | POA: Diagnosis not present

## 2021-12-10 DIAGNOSIS — R945 Abnormal results of liver function studies: Secondary | ICD-10-CM | POA: Diagnosis not present

## 2021-12-10 DIAGNOSIS — F419 Anxiety disorder, unspecified: Secondary | ICD-10-CM | POA: Diagnosis not present

## 2021-12-10 DIAGNOSIS — K219 Gastro-esophageal reflux disease without esophagitis: Secondary | ICD-10-CM | POA: Diagnosis not present

## 2021-12-10 DIAGNOSIS — E785 Hyperlipidemia, unspecified: Secondary | ICD-10-CM | POA: Diagnosis not present

## 2021-12-10 DIAGNOSIS — E669 Obesity, unspecified: Secondary | ICD-10-CM | POA: Diagnosis not present

## 2021-12-10 DIAGNOSIS — D631 Anemia in chronic kidney disease: Secondary | ICD-10-CM | POA: Diagnosis not present

## 2021-12-29 DIAGNOSIS — Z09 Encounter for follow-up examination after completed treatment for conditions other than malignant neoplasm: Secondary | ICD-10-CM | POA: Diagnosis not present

## 2021-12-29 DIAGNOSIS — H524 Presbyopia: Secondary | ICD-10-CM | POA: Diagnosis not present

## 2022-02-10 DIAGNOSIS — L820 Inflamed seborrheic keratosis: Secondary | ICD-10-CM | POA: Diagnosis not present

## 2022-02-10 DIAGNOSIS — D170 Benign lipomatous neoplasm of skin and subcutaneous tissue of head, face and neck: Secondary | ICD-10-CM | POA: Diagnosis not present

## 2022-02-10 DIAGNOSIS — L578 Other skin changes due to chronic exposure to nonionizing radiation: Secondary | ICD-10-CM | POA: Diagnosis not present

## 2022-02-12 DIAGNOSIS — E669 Obesity, unspecified: Secondary | ICD-10-CM | POA: Diagnosis not present

## 2022-02-12 DIAGNOSIS — F325 Major depressive disorder, single episode, in full remission: Secondary | ICD-10-CM | POA: Diagnosis not present

## 2022-02-12 DIAGNOSIS — E785 Hyperlipidemia, unspecified: Secondary | ICD-10-CM | POA: Diagnosis not present

## 2022-02-12 DIAGNOSIS — K219 Gastro-esophageal reflux disease without esophagitis: Secondary | ICD-10-CM | POA: Diagnosis not present

## 2022-02-12 DIAGNOSIS — C61 Malignant neoplasm of prostate: Secondary | ICD-10-CM | POA: Diagnosis not present

## 2022-02-12 DIAGNOSIS — R945 Abnormal results of liver function studies: Secondary | ICD-10-CM | POA: Diagnosis not present

## 2022-02-12 DIAGNOSIS — D631 Anemia in chronic kidney disease: Secondary | ICD-10-CM | POA: Diagnosis not present

## 2022-02-12 DIAGNOSIS — F419 Anxiety disorder, unspecified: Secondary | ICD-10-CM | POA: Diagnosis not present

## 2022-02-12 DIAGNOSIS — E1121 Type 2 diabetes mellitus with diabetic nephropathy: Secondary | ICD-10-CM | POA: Diagnosis not present

## 2022-02-12 DIAGNOSIS — N183 Chronic kidney disease, stage 3 unspecified: Secondary | ICD-10-CM | POA: Diagnosis not present

## 2022-02-12 DIAGNOSIS — E1169 Type 2 diabetes mellitus with other specified complication: Secondary | ICD-10-CM | POA: Diagnosis not present

## 2022-02-12 DIAGNOSIS — I10 Essential (primary) hypertension: Secondary | ICD-10-CM | POA: Diagnosis not present

## 2022-04-21 DIAGNOSIS — C61 Malignant neoplasm of prostate: Secondary | ICD-10-CM | POA: Diagnosis not present

## 2022-04-21 DIAGNOSIS — R945 Abnormal results of liver function studies: Secondary | ICD-10-CM | POA: Diagnosis not present

## 2022-04-21 DIAGNOSIS — D631 Anemia in chronic kidney disease: Secondary | ICD-10-CM | POA: Diagnosis not present

## 2022-04-21 DIAGNOSIS — E1121 Type 2 diabetes mellitus with diabetic nephropathy: Secondary | ICD-10-CM | POA: Diagnosis not present

## 2022-04-21 DIAGNOSIS — F419 Anxiety disorder, unspecified: Secondary | ICD-10-CM | POA: Diagnosis not present

## 2022-04-21 DIAGNOSIS — K219 Gastro-esophageal reflux disease without esophagitis: Secondary | ICD-10-CM | POA: Diagnosis not present

## 2022-04-21 DIAGNOSIS — E785 Hyperlipidemia, unspecified: Secondary | ICD-10-CM | POA: Diagnosis not present

## 2022-04-21 DIAGNOSIS — F325 Major depressive disorder, single episode, in full remission: Secondary | ICD-10-CM | POA: Diagnosis not present

## 2022-04-21 DIAGNOSIS — E669 Obesity, unspecified: Secondary | ICD-10-CM | POA: Diagnosis not present

## 2022-04-21 DIAGNOSIS — I10 Essential (primary) hypertension: Secondary | ICD-10-CM | POA: Diagnosis not present

## 2022-04-21 DIAGNOSIS — E1169 Type 2 diabetes mellitus with other specified complication: Secondary | ICD-10-CM | POA: Diagnosis not present

## 2022-04-21 DIAGNOSIS — N183 Chronic kidney disease, stage 3 unspecified: Secondary | ICD-10-CM | POA: Diagnosis not present

## 2022-06-09 DIAGNOSIS — R5382 Chronic fatigue, unspecified: Secondary | ICD-10-CM | POA: Diagnosis not present

## 2022-06-09 DIAGNOSIS — D649 Anemia, unspecified: Secondary | ICD-10-CM | POA: Diagnosis not present

## 2022-06-09 DIAGNOSIS — I10 Essential (primary) hypertension: Secondary | ICD-10-CM | POA: Diagnosis not present

## 2022-07-07 DIAGNOSIS — E1121 Type 2 diabetes mellitus with diabetic nephropathy: Secondary | ICD-10-CM | POA: Diagnosis not present

## 2022-07-07 DIAGNOSIS — M7021 Olecranon bursitis, right elbow: Secondary | ICD-10-CM | POA: Diagnosis not present

## 2022-10-08 DIAGNOSIS — E785 Hyperlipidemia, unspecified: Secondary | ICD-10-CM | POA: Diagnosis not present

## 2022-10-08 DIAGNOSIS — E1121 Type 2 diabetes mellitus with diabetic nephropathy: Secondary | ICD-10-CM | POA: Diagnosis not present
